# Patient Record
Sex: Female | Born: 1960 | Race: White | Hispanic: No | Marital: Married | Smoking: Never smoker
Health system: Southern US, Community
[De-identification: ages and names within clinical notes are randomized; demographics above are authoritative.]

## PROBLEM LIST (undated history)

## (undated) DIAGNOSIS — Z8659 Personal history of other mental and behavioral disorders: Secondary | ICD-10-CM

## (undated) DIAGNOSIS — R002 Palpitations: Secondary | ICD-10-CM

## (undated) DIAGNOSIS — F32A Depression, unspecified: Secondary | ICD-10-CM

## (undated) DIAGNOSIS — R7309 Other abnormal glucose: Secondary | ICD-10-CM

## (undated) DIAGNOSIS — F329 Major depressive disorder, single episode, unspecified: Secondary | ICD-10-CM

## (undated) HISTORY — DX: Major depressive disorder, single episode, unspecified: F32.9

## (undated) HISTORY — DX: Depression, unspecified: F32.A

## (undated) HISTORY — DX: Palpitations: R00.2

## (undated) HISTORY — PX: OTHER SURGICAL HISTORY: SHX169

## (undated) HISTORY — DX: Personal history of other mental and behavioral disorders: Z86.59

## (undated) HISTORY — DX: Other abnormal glucose: R73.09

## (undated) HISTORY — PX: HERNIA REPAIR: SHX51

---

## 1997-09-07 ENCOUNTER — Other Ambulatory Visit: Admission: RE | Admit: 1997-09-07 | Discharge: 1997-09-07 | Payer: Self-pay | Admitting: Obstetrics and Gynecology

## 1998-11-15 ENCOUNTER — Other Ambulatory Visit: Admission: RE | Admit: 1998-11-15 | Discharge: 1998-11-15 | Payer: Self-pay | Admitting: Obstetrics and Gynecology

## 2002-10-01 ENCOUNTER — Other Ambulatory Visit: Admission: RE | Admit: 2002-10-01 | Discharge: 2002-10-01 | Payer: Self-pay | Admitting: Obstetrics and Gynecology

## 2003-02-09 ENCOUNTER — Encounter: Admission: RE | Admit: 2003-02-09 | Discharge: 2003-02-09 | Payer: Self-pay | Admitting: Internal Medicine

## 2003-10-24 ENCOUNTER — Other Ambulatory Visit: Admission: RE | Admit: 2003-10-24 | Discharge: 2003-10-24 | Payer: Self-pay | Admitting: Obstetrics and Gynecology

## 2004-12-06 ENCOUNTER — Other Ambulatory Visit: Admission: RE | Admit: 2004-12-06 | Discharge: 2004-12-06 | Payer: Self-pay | Admitting: Obstetrics and Gynecology

## 2005-03-22 ENCOUNTER — Encounter: Admission: RE | Admit: 2005-03-22 | Discharge: 2005-03-22 | Payer: Self-pay | Admitting: Obstetrics and Gynecology

## 2006-07-21 ENCOUNTER — Ambulatory Visit: Payer: Self-pay | Admitting: Internal Medicine

## 2006-07-21 ENCOUNTER — Encounter: Payer: Self-pay | Admitting: Internal Medicine

## 2006-07-22 ENCOUNTER — Ambulatory Visit: Payer: Self-pay | Admitting: Cardiology

## 2006-09-22 ENCOUNTER — Ambulatory Visit: Payer: Self-pay | Admitting: Internal Medicine

## 2006-09-22 DIAGNOSIS — F3289 Other specified depressive episodes: Secondary | ICD-10-CM | POA: Insufficient documentation

## 2006-09-22 DIAGNOSIS — J309 Allergic rhinitis, unspecified: Secondary | ICD-10-CM | POA: Insufficient documentation

## 2006-09-22 DIAGNOSIS — Z8659 Personal history of other mental and behavioral disorders: Secondary | ICD-10-CM | POA: Insufficient documentation

## 2006-09-22 DIAGNOSIS — R002 Palpitations: Secondary | ICD-10-CM | POA: Insufficient documentation

## 2006-09-22 DIAGNOSIS — F329 Major depressive disorder, single episode, unspecified: Secondary | ICD-10-CM | POA: Insufficient documentation

## 2006-09-22 LAB — CONVERTED CEMR LAB
Bilirubin Urine: NEGATIVE
Glucose, Urine, Semiquant: NEGATIVE
Ketones, urine, test strip: NEGATIVE
Nitrite: NEGATIVE
Protein, U semiquant: NEGATIVE
Specific Gravity, Urine: 1.02
Urobilinogen, UA: 0.2
WBC Urine, dipstick: NEGATIVE
pH: 7

## 2006-09-30 LAB — CONVERTED CEMR LAB
ALT: 11 units/L (ref 0–35)
AST: 13 units/L (ref 0–37)
Albumin: 3.4 g/dL — ABNORMAL LOW (ref 3.5–5.2)
Alkaline Phosphatase: 35 units/L — ABNORMAL LOW (ref 39–117)
BUN: 9 mg/dL (ref 6–23)
Basophils Absolute: 0 10*3/uL (ref 0.0–0.1)
Basophils Relative: 0.6 % (ref 0.0–1.0)
Bilirubin, Direct: 0.2 mg/dL (ref 0.0–0.3)
CO2: 33 meq/L — ABNORMAL HIGH (ref 19–32)
Calcium: 9.1 mg/dL (ref 8.4–10.5)
Chloride: 108 meq/L (ref 96–112)
Cholesterol: 178 mg/dL (ref 0–200)
Creatinine, Ser: 0.9 mg/dL (ref 0.4–1.2)
Eosinophils Absolute: 0.1 10*3/uL (ref 0.0–0.6)
Eosinophils Relative: 2.4 % (ref 0.0–5.0)
Free T4: 0.7 ng/dL (ref 0.6–1.6)
GFR calc Af Amer: 87 mL/min
GFR calc non Af Amer: 72 mL/min
Glucose, Bld: 102 mg/dL — ABNORMAL HIGH (ref 70–99)
HCT: 37.4 % (ref 36.0–46.0)
HDL: 41.7 mg/dL (ref >39.0)
Hemoglobin: 13 g/dL (ref 12.0–15.0)
LDL Cholesterol: 128 mg/dL — ABNORMAL HIGH (ref 0–99)
Lymphocytes Relative: 32.3 % (ref 12.0–46.0)
MCHC: 34.8 g/dL (ref 30.0–36.0)
MCV: 86.4 fL (ref 78.0–100.0)
Monocytes Absolute: 0.3 10*3/uL (ref 0.2–0.7)
Monocytes Relative: 7.2 % (ref 3.0–11.0)
Neutro Abs: 2.6 10*3/uL (ref 1.4–7.7)
Neutrophils Relative %: 57.5 % (ref 43.0–77.0)
Platelets: 197 10*3/uL (ref 150–400)
Potassium: 4.7 meq/L (ref 3.5–5.1)
RBC: 4.33 M/uL (ref 3.87–5.11)
RDW: 12.3 % (ref 11.5–14.6)
Sodium: 144 meq/L (ref 135–145)
TSH: 1.23 microintl units/mL (ref 0.35–5.50)
Total Bilirubin: 1 mg/dL (ref 0.3–1.2)
Total CHOL/HDL Ratio: 4.3
Total Protein: 6.1 g/dL (ref 6.0–8.3)
Triglycerides: 43 mg/dL (ref 0–149)
VLDL: 9 mg/dL (ref 0–40)
WBC: 4.4 10*3/uL — ABNORMAL LOW (ref 4.5–10.5)

## 2006-10-15 ENCOUNTER — Encounter: Payer: Self-pay | Admitting: Internal Medicine

## 2006-10-15 ENCOUNTER — Encounter: Admission: RE | Admit: 2006-10-15 | Discharge: 2006-10-15 | Payer: Self-pay | Admitting: Obstetrics and Gynecology

## 2007-06-25 ENCOUNTER — Encounter: Payer: Self-pay | Admitting: Internal Medicine

## 2007-07-07 ENCOUNTER — Encounter: Payer: Self-pay | Admitting: Internal Medicine

## 2007-10-19 ENCOUNTER — Encounter: Admission: RE | Admit: 2007-10-19 | Discharge: 2007-10-19 | Payer: Self-pay | Admitting: Internal Medicine

## 2007-12-01 ENCOUNTER — Ambulatory Visit: Payer: Self-pay | Admitting: Internal Medicine

## 2007-12-01 DIAGNOSIS — R7309 Other abnormal glucose: Secondary | ICD-10-CM | POA: Insufficient documentation

## 2007-12-02 ENCOUNTER — Encounter: Payer: Self-pay | Admitting: Internal Medicine

## 2007-12-02 LAB — CONVERTED CEMR LAB
BUN: 13 mg/dL (ref 6–23)
CO2: 30 meq/L (ref 19–32)
Calcium: 9.2 mg/dL (ref 8.4–10.5)
Chloride: 102 meq/L (ref 96–112)
Creatinine, Ser: 0.9 mg/dL (ref 0.4–1.2)
GFR calc Af Amer: 87 mL/min
GFR calc non Af Amer: 72 mL/min
Glucose, Bld: 83 mg/dL (ref 70–99)
Hgb A1c MFr Bld: 5.1 % (ref 4.6–6.0)
Potassium: 4 meq/L (ref 3.5–5.1)
Sodium: 139 meq/L (ref 135–145)

## 2008-06-03 ENCOUNTER — Ambulatory Visit: Payer: Self-pay | Admitting: Internal Medicine

## 2008-06-03 DIAGNOSIS — N760 Acute vaginitis: Secondary | ICD-10-CM | POA: Insufficient documentation

## 2008-06-06 LAB — CONVERTED CEMR LAB
BUN: 16 mg/dL (ref 6–23)
CO2: 29 meq/L (ref 19–32)
Calcium: 8.7 mg/dL (ref 8.4–10.5)
Chloride: 106 meq/L (ref 96–112)
Creatinine, Ser: 0.9 mg/dL (ref 0.4–1.2)
Free T4: 0.8 ng/dL (ref 0.6–1.6)
GFR calc non Af Amer: 71.29 mL/min (ref >60)
Glucose, Bld: 86 mg/dL (ref 70–99)
Hgb A1c MFr Bld: 5.2 % (ref 4.6–6.5)
Potassium: 3.6 meq/L (ref 3.5–5.1)
Sodium: 140 meq/L (ref 135–145)
T3, Free: 2.4 pg/mL (ref 2.3–4.2)
TSH: 1.57 microintl units/mL (ref 0.35–5.50)

## 2008-07-05 ENCOUNTER — Ambulatory Visit: Payer: Self-pay | Admitting: Internal Medicine

## 2008-07-05 DIAGNOSIS — I4949 Other premature depolarization: Secondary | ICD-10-CM | POA: Insufficient documentation

## 2008-07-15 ENCOUNTER — Encounter: Payer: Self-pay | Admitting: Internal Medicine

## 2008-07-15 ENCOUNTER — Ambulatory Visit: Payer: Self-pay

## 2008-11-29 ENCOUNTER — Encounter: Admission: RE | Admit: 2008-11-29 | Discharge: 2008-11-29 | Payer: Self-pay | Admitting: Obstetrics and Gynecology

## 2009-05-25 ENCOUNTER — Ambulatory Visit: Payer: Self-pay | Admitting: Sports Medicine

## 2009-05-25 DIAGNOSIS — M79609 Pain in unspecified limb: Secondary | ICD-10-CM | POA: Insufficient documentation

## 2009-05-25 DIAGNOSIS — M25519 Pain in unspecified shoulder: Secondary | ICD-10-CM | POA: Insufficient documentation

## 2009-05-25 DIAGNOSIS — M2170 Unequal limb length (acquired), unspecified site: Secondary | ICD-10-CM | POA: Insufficient documentation

## 2009-06-28 ENCOUNTER — Ambulatory Visit: Payer: Self-pay | Admitting: Sports Medicine

## 2009-06-28 DIAGNOSIS — R269 Unspecified abnormalities of gait and mobility: Secondary | ICD-10-CM | POA: Insufficient documentation

## 2009-11-30 ENCOUNTER — Encounter: Admission: RE | Admit: 2009-11-30 | Discharge: 2009-11-30 | Payer: Self-pay | Admitting: Obstetrics and Gynecology

## 2009-12-04 ENCOUNTER — Ambulatory Visit: Payer: Self-pay | Admitting: Internal Medicine

## 2009-12-04 DIAGNOSIS — M25549 Pain in joints of unspecified hand: Secondary | ICD-10-CM | POA: Insufficient documentation

## 2009-12-04 LAB — CONVERTED CEMR LAB: Glucose, Bld: 154 mg/dL

## 2010-03-01 NOTE — Progress Notes (Signed)
Summary: [facility]  [facility]   Imported By: Sherian Rein 07/08/2009 10:12:04  _____________________________________________________________________  External Attachment:    Type:   Image     Comment:   External Document

## 2010-03-01 NOTE — Assessment & Plan Note (Signed)
Summary: follow up appt/ssc   Vital Signs:  Patient profile:   50 year old female Menstrual status:  regular LMP:     11/20/2009 Weight:      173 pounds Pulse rate:   72 / minute BP sitting:   100 / 60  (right arm) Cuff size:   regular  Vitals Entered By: Romualdo Bolk, CMA (AAMA) (December 04, 2009 1:21 PM) CC: follow-up visit on prozac. Pt wants to discuss sore joints in hands.  LMP (date): 11/20/2009 Menarche (age onset years): 16   Menses interval (days): 28 Menstrual flow (days): 4-5 Enter LMP: 11/20/2009   History of Present Illness: Joyce Jefferson comes in today  for follow up of meds . in regard to her PROZAC  shetakes  it about 5 days per  week   ( forgets some)  but usually compliance .  When weans off   gets irritable and anxious.   still has teen ager at home .  Summer  had  Gyne check in   Dr Marcelle Overlie.  Mammo: utd. ocass vaginal  odor.   ?.   no discharge     Exercises  and training for 1/2 marathon and does yoga.   has leg length discrep   has insert .  Gets  soreness in some finger  but no stiffness every am and no swelling seem area infovlve is pip area.  No injury   Preventive Screening-Counseling & Management  Alcohol-Tobacco     Alcohol drinks/day: 2     Alcohol type: wine     Smoking Status: never  Caffeine-Diet-Exercise     Caffeine use/day: 4     Does Patient Exercise: yes     Type of exercise: Eplicitcal, walk and wts     Times/week: 5  Current Medications (verified): 1)  Fluoxetine Hcl 20 Mg Caps (Fluoxetine Hcl) .Marland Kitchen.. 1 By Mouth Once Daily  Allergies (verified): 1)  ! Penicillin 2)  ! Sulfa  Past History:  Past medical, surgical, family and social histories (including risk factors) reviewed, and no changes noted (except as noted below).  Past Medical History: VAGINITIS (ICD-616.10) PALPITATIONS, RECURRENT (ICD-785.1)   echo 2010 HYPERGLYCEMIA, MILD (ICD-790.29) Hx of BULIMIA, HX OF (ICD-V11.8) DEPRESSION  (ICD-311) ALLERGIC RHINITIS (ICD-477.9)  Past Surgical History: Reviewed history from 07/05/2008 and no changes required. Repair of Prolapsed Rectum  Hernia repair  Past History:  Care Management: None Current Gynecology: [place]  Family History: Reviewed history from 07/05/2008 and no changes required. Family History of Alcoholism/Addiction Family History Hypertension Family History of Colon CA 1st degree relative <60 mom  + diabetes son had vsd that closed?  Neg thyroid  sig arrythmias.     Social History: Reviewed history from 07/05/2008 and no changes required. Lives in [place]. Married Never Smoked Alcohol use-yes  (1-2 glasses of wine/day) Regular exercise-yes Owns a wine import company  Review of Systems  The patient denies anorexia, fever, weight loss, weight gain, vision loss, decreased hearing, abdominal pain, melena, hematochezia, severe indigestion/heartburn, muscle weakness, enlarged lymph nodes, and angioedema.         only ocass palpitations  Physical Exam  General:  Well-developed,well-nourished,in no acute distress; alert,appropriate and cooperative throughout examination Head:  normocephalic and atraumatic.   Eyes:  vision grossly intact.   Neck:  No deformities, masses, or tenderness noted. Lungs:  Normal respiratory effort, chest expands symmetrically. Lungs are clear to auscultation, no crackles or wheezes. Heart:  Normal rate and regular rhythm. S1 and S2  normal without gallop, murmur, click, rub or other extra sounds.no lifts.   Abdomen:  Bowel sounds positive,abdomen soft and non-tender without masses, organomegaly or   noted. Msk:   no swelling of joints in hands and no nodes  good ROM  Pulses:  pulses intact without delay   Extremities:  no clubbing cyanosis or edema  Neurologic:  non focal  Skin:  turgor normal, color normal, no ecchymoses, and no petechiae.   Cervical Nodes:  No lymphadenopathy noted Psych:  Oriented X3, normally  interactive, good eye contact, not anxious appearing, and not depressed appearing.     Impression & Recommendations:  Problem # 1:  DEPRESSION (ICD-311) stable    disc continuation of med at this time Her updated medication list for this problem includes:    Fluoxetine Hcl 20 Mg Caps (Fluoxetine hcl) .Marland Kitchen... 1 by mouth once daily  Problem # 2:  HYPERGLYCEMIA, MILD (ICD-790.29) is pp today   slightly iup but  ok      lifestyle intervention and follow  hg a1c nl in past  Problem # 3:  PALPITATIONS, RECURRENT (ICD-785.1) Assessment: Improved less  cards consult in the past nl echo   Problem # 4:  ALLERGIC RHINITIS (ICD-477.9) Assessment: Comment Only  Problem # 5:  JOINT PAIN, HAND (ICD-719.44) seems like minor arthritis    call with alarm features etc.   Complete Medication List: 1)  Fluoxetine Hcl 20 Mg Caps (Fluoxetine hcl) .Marland Kitchen.. 1 by mouth once daily  Other Orders: Admin 1st Vaccine (16109) Flu Vaccine 54yrs + (60454)  Patient Instructions: 1)  continue   medication. and healthy lifestyle  2)  next year  do   full cpx with  labs.  Prescriptions: FLUOXETINE HCL 20 MG CAPS (FLUOXETINE HCL) 1 by mouth once daily  #30 x 11   Entered and Authorized by:   Madelin Headings MD   Signed by:   Madelin Headings MD on 12/04/2009   Method used:   Electronically to        [facility]       [address]       [place], [place]  [address]       Ph: [phone]       Fax: [phone]   RxID:   [id number]    Orders Added: 1)  Admin 1st Vaccine [90471] 2)  Flu Vaccine 52yrs + [27253] 3)  Est. Patient Level IV [66440]         Flu Vaccine Consent Questions     Do you have a history of severe allergic reactions to this vaccine? no    Any prior history of allergic reactions to egg and/or gelatin? no    Do you have a sensitivity to the preservative Thimersol? no    Do you have a past history of Guillan-Barre Syndrome? no    Do you  currently have an acute febrile illness? no    Have you ever had a severe reaction to latex? no    Vaccine information given and explained to patient? yes    Are you currently pregnant? no    Lot Number:[id number]   Exp Date:07/28/2010   Site Given  Left Deltoid IM .lbflu Romualdo Bolk, CMA (AAMA)  December 04, 2009 1:24 PM  Laboratory Results   Blood Tests     Glucose (random): 154 mg/dL   (Normal Range: 34-742)  Comments: Romualdo Bolk, CMA (AAMA)  December 04, 2009 1:48 PM

## 2010-03-01 NOTE — Assessment & Plan Note (Signed)
Summary: ORTHOTICS,MC   Vital Signs:  Patient profile:   50 year old female Menstrual status:  regular BP sitting:   91 / 59  Vitals Entered By: Lillia Pauls CMA (June 28, 2009 9:41 AM)  Referring Provider:  Berniece Andreas, MD Primary Provider:  Madelin Headings MD   History of Present Illness: Hx of bilat foot pain this recurs w running had PF at one time improved in sport insole w lift gait felt better and able to run  comes for custom orthotic  note has not done shoulder exercise regimen very much  not much change in those sxsi  Allergies: 1)  ! Penicillin 2)  ! Sulfa  Physical Exam  General:  Well-developed,well-nourished,in no acute distress; alert,appropriate and cooperative throughout examination Msk:  Msk:  left rear foot valgus pronation of RT w loss of long arch lat 5th hammer  RT midfoot shift pronation 5th hammer toe  lat foot bilat curing inward  norm post tib fxn Extremities:  gait coorrects in sports inserts w lift otherwise leans to shorter side   Impression & Recommendations:  Problem # 1:  ABNORMALITY OF GAIT (ICD-781.2)  Patient was fitted for a standard, cushioned, semi-rigid orthotic.  The orthotic was heated and the patient stood on the orthotic blank positioned on the orthotic stand. The patient was positioned in subtalar neutral position and 10 degrees of ankle dorsiflexion in a weight bearing stance. After completion of molding a stable based was applied to the orthotic blank.   The blank was ground to a stable position for weight bearing. size 10 red cambray base  blue EVA mid dens posting  lift on RT with taper - burgundy EVA additional orthotic padding  none  40 mins  Orders: Games developer, each unit (L3002)  Problem # 2:  UNEQUAL LEG LENGTH (ICD-736.81)  corrected w orthotic  this seems to trigger the abn gait  Orders: Orthotic Materials, each unit (Z6109)  Problem # 3:  FOOT PAIN, BILATERAL  (ICD-729.5)  better in sport insoles  will try to use custom oththoics for running as this cuts her pain  Orders: Orthotic Materials, each unit (L3002)  Complete Medication List: 1)  Fluoxetine Hcl 20 Mg Tabs (Fluoxetine hcl) .... 1/2 by mouth two times a day or as directed

## 2010-03-01 NOTE — Assessment & Plan Note (Signed)
Summary: NP,HEEL/SHOULDER PAIN,MC   Vital Signs:  Patient profile:   50 year old female Menstrual status:  regular Height:      69 inches Weight:      165 pounds BMI:     24.45 BP sitting:   111 / 73  Vitals Entered By: Joyce Jefferson CMA (May 25, 2009 11:33 AM)  Referring Provider:  Berniece Andreas, MD Primary Provider:  Madelin Headings MD   History of Present Illness: RT shoulder pain for almost a year does Yoga =- gentle yoga at sport time Friend of Joyce Jefferson who sent her here  down dog painful abduction painful no specific injury not wakening at nite  runs 3 to 4 x per wk sharp pain in bottom of heel on left 1 mo ago feeling better now  RT PF 3 years ago  was tried in rigid half lenght orthotic and this was painful  Allergies: 1)  ! Penicillin 2)  ! Sulfa  Physical Exam  General:  Well-developed,well-nourished,in no acute distress; alert,appropriate and cooperative throughout examination Msk:  left rear foot valgus pronation of RT w loss of long arch lat 5th hammer  RT midfoot shift pronation 5th hammer toe  lat foot bilat curing inward  norm post tib fxn  left leg is 1.5 cm long Extremities:  Inspection reveals no abnormalities or assymetry; no atrophy noted; palpation is unremarkable;  ROM is full in all planes. specific strength testing of Rotator cuff mm reveals good strength throughout;  mild sings of impingement;  speeds and yergason's tests normal;  no labral pathology noted; norm scapular function observed.  negative painful arc and no drop arm sign.  Neurologic:  Gait is very good However without leg length correction she leans to RT side   Impression & Recommendations:  Problem # 1:  SHOULDER PAIN, RIGHT (ICD-719.41) This seems like a minor RC inujuty with suprspinatus tendon irritation  will give a std rehabilitaiton program using therabeand that she can do at [facility]  if persistent will do more eval  Problem # 2:  FOOT PAIN,  BILATERAL (ICD-729.5)  This is associated with a lot of foot breakdown  I think she is an excellent candidate for custom orhotics  will RTC for these  trial on temporary sports insole  Orders: Sports Insoles (L3510)  Problem # 3:  UNEQUAL LEG LENGTH (ICD-736.81)  tapered wedge added to RT orhtoitc to help correct lenght  this feels very comfortable and improves her running gait  Orders: Sports Insoles (L3510)  Complete Medication List: 1)  Fluoxetine Hcl 20 Mg Tabs (Fluoxetine hcl) .... 1/2 by mouth two times a day or as directed

## 2010-08-09 IMAGING — MG MM SCREEN MAMMOGRAM BILATERAL
4 series · 4 of 4 positions shown · non-contrast
Comparison: none

DG SCREEN MAMMOGRAM BILATERAL
Bilateral CC and MLO view(s) were taken.

DIGITAL SCREENING MAMMOGRAM WITH CAD:
The breast tissue is heterogeneously dense.  No masses or malignant type calcifications are 
identified.  Compared with prior studies.

[R CC]
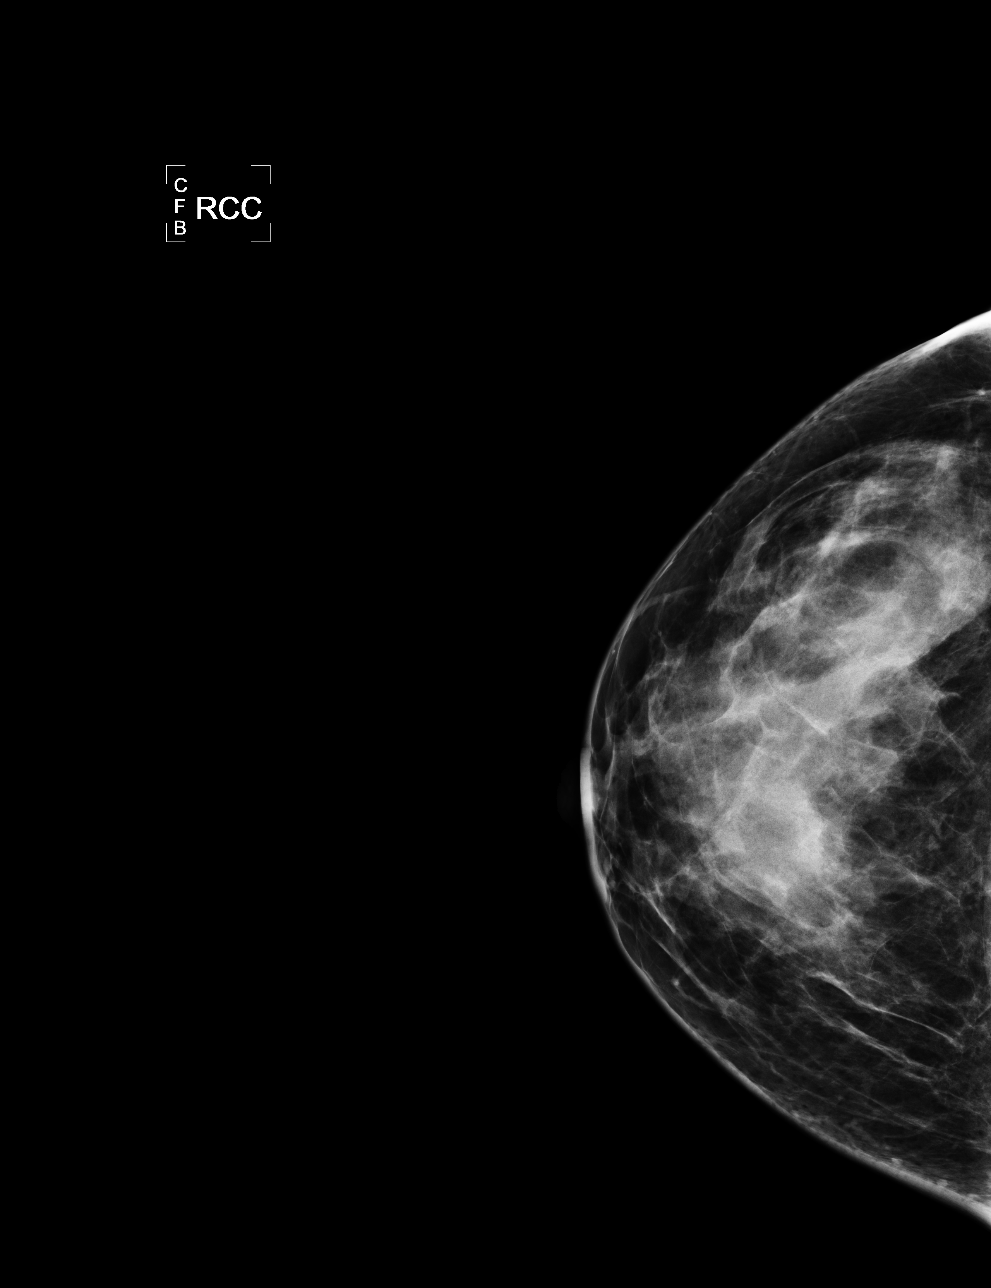

[L CC]
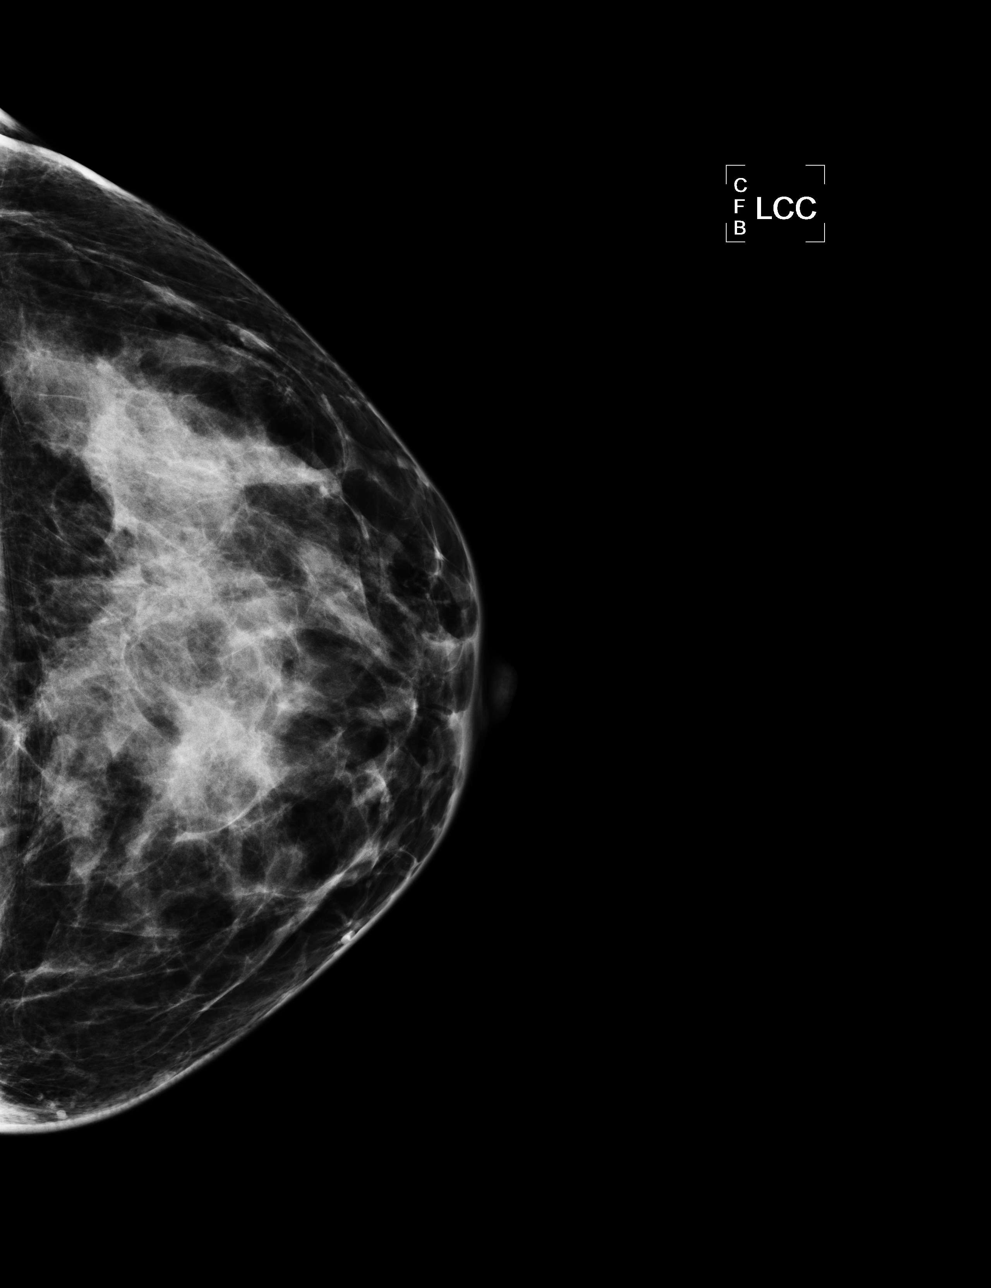

[L MLO]
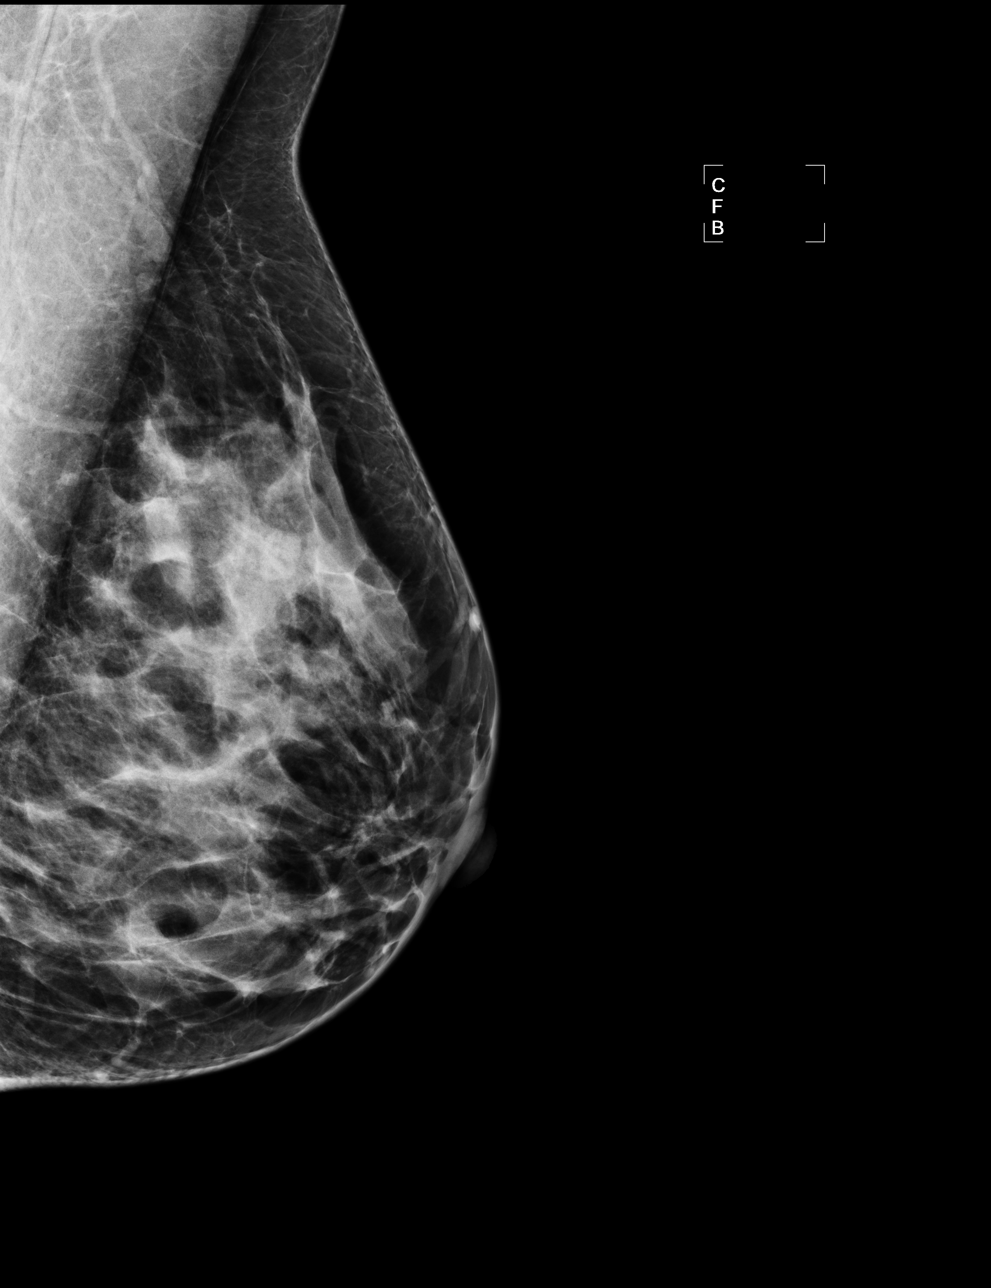

[R MLO]
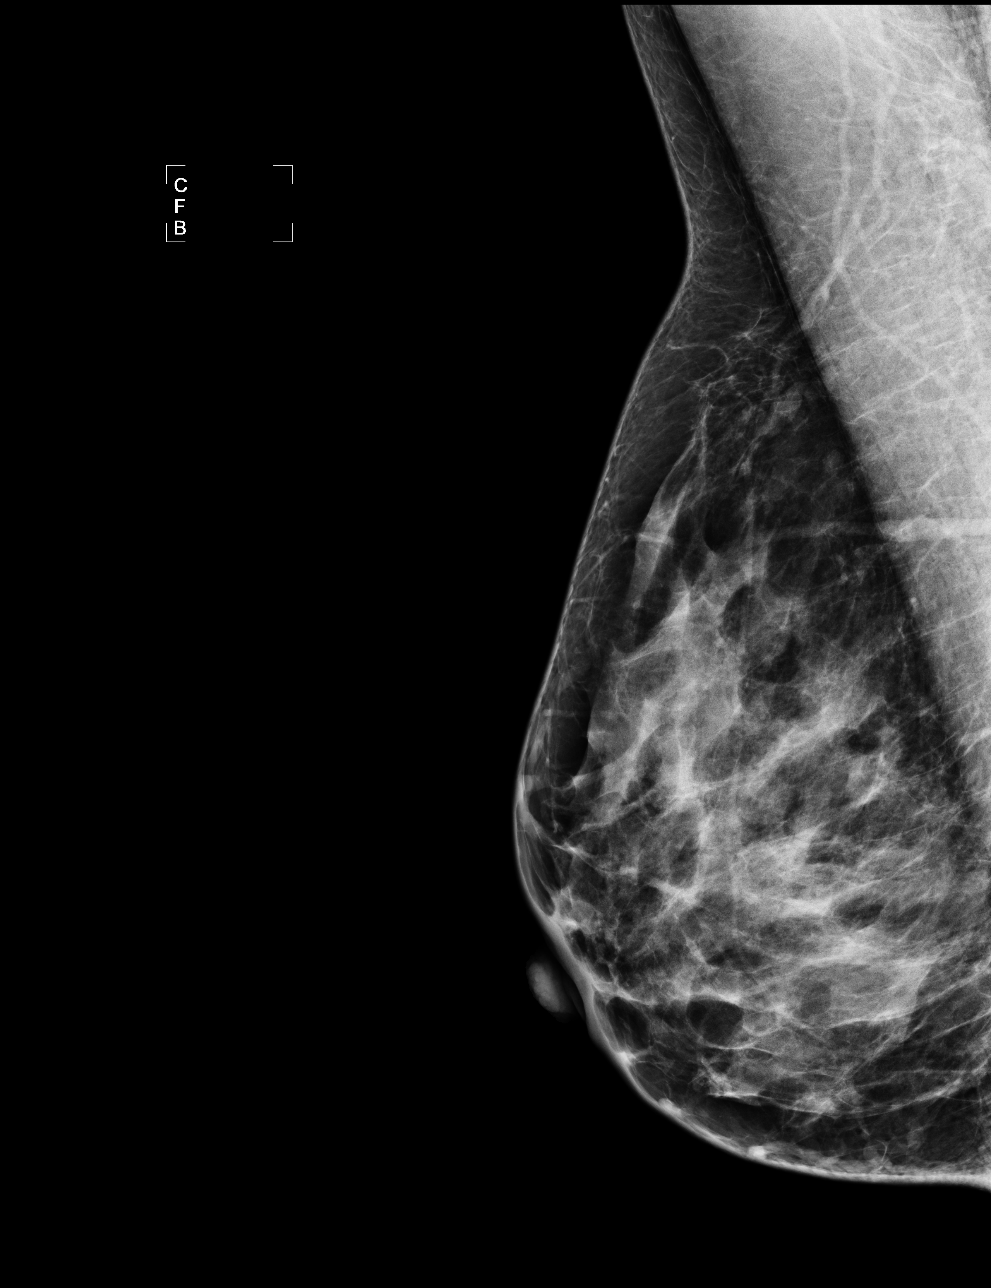

[4 of 4 positions shown; findings below may reference images not displayed]

IMPRESSION: No specific mammographic evidence of malignancy.  Next screening mammogram is recommended in one 
year.

ASSESSMENT: Negative - BI-RADS 1

Screening mammogram in 1 year.
ANALYZED BY COMPUTER AIDED DETECTION. , THIS PROCEDURE WAS A DIGITAL MAMMOGRAM.

## 2010-08-15 ENCOUNTER — Encounter: Payer: Self-pay | Admitting: Internal Medicine

## 2010-08-15 ENCOUNTER — Ambulatory Visit (INDEPENDENT_AMBULATORY_CARE_PROVIDER_SITE_OTHER): Payer: Self-pay | Admitting: Internal Medicine

## 2010-08-15 VITALS — BP 100/70 | HR 60 | Temp 98.4°F

## 2010-08-15 DIAGNOSIS — L988 Other specified disorders of the skin and subcutaneous tissue: Secondary | ICD-10-CM | POA: Insufficient documentation

## 2010-08-15 DIAGNOSIS — L989 Disorder of the skin and subcutaneous tissue, unspecified: Secondary | ICD-10-CM

## 2010-08-15 NOTE — Progress Notes (Signed)
  Subjective:    Patient ID: Joyce Jefferson, female    DOB: [birth year], 50 y.o.   MRN: [id number]  HPI Comesin today for new problem NOted red bump left breast that lasted 2-3 weeks and then resolved. Then has had a few weeks of red bump on right lateral breast area without  Pain itching or othre.walks the dog and gets hot and sweaty and may be from this but just wants to get checked to be safe.   Review of Systems No fever feels well.    Objective:   Physical Exam WDWN in nad  Breasts/skin    Right  With 1 mm rd area with ? Central pore and no tenderness but some seed like area beneath this. No specific breast lump    No discharge  Axilla is clear      Assessment & Plan:  Skin bump  On breast   prob not breast related  Seems skin related . Fu if  persistent or progressive

## 2010-08-15 NOTE — Patient Instructions (Signed)
This seems like a skin cyst .     But recheck with [place]   persistent or progressive . Can use warm compress .

## 2010-09-17 ENCOUNTER — Other Ambulatory Visit (INDEPENDENT_AMBULATORY_CARE_PROVIDER_SITE_OTHER): Payer: BC Managed Care – PPO

## 2010-09-17 DIAGNOSIS — Z0000 Encounter for general adult medical examination without abnormal findings: Secondary | ICD-10-CM

## 2010-09-17 LAB — BASIC METABOLIC PANEL
BUN: 9 mg/dL (ref 6–23)
CO2: 31 mEq/L (ref 19–32)
Calcium: 9 mg/dL (ref 8.4–10.5)
Chloride: 104 mEq/L (ref 96–112)
Creatinine, Ser: 0.9 mg/dL (ref 0.4–1.2)
GFR: 74.41 mL/min (ref >60.00)
Glucose, Bld: 98 mg/dL (ref 70–99)
Potassium: 4.8 mEq/L (ref 3.5–5.1)
Sodium: 141 mEq/L (ref 135–145)

## 2010-09-17 LAB — POCT URINALYSIS DIPSTICK
Bilirubin, UA: NEGATIVE
Glucose, UA: NEGATIVE
Nitrite, UA: NEGATIVE
Protein, UA: NEGATIVE
Spec Grav, UA: 1.015
Urobilinogen, UA: 0.2
pH, UA: NEGATIVE

## 2010-09-17 LAB — CBC WITH DIFFERENTIAL/PLATELET
Basophils Absolute: 0 10*3/uL (ref 0.0–0.1)
Basophils Relative: 0.5 % (ref 0.0–3.0)
Eosinophils Absolute: 0.1 10*3/uL (ref 0.0–0.7)
Eosinophils Relative: 2.4 % (ref 0.0–5.0)
HCT: 40.1 % (ref 36.0–46.0)
Lymphs Abs: 1.5 10*3/uL (ref 0.7–4.0)
MCHC: 33.7 g/dL (ref 30.0–36.0)
MCV: 90.1 fl (ref 78.0–100.0)
Monocytes Absolute: 0.4 10*3/uL (ref 0.1–1.0)
Monocytes Relative: 7.9 % (ref 3.0–12.0)
Neutro Abs: 2.7 10*3/uL (ref 1.4–7.7)
Neutrophils Relative %: 58.2 % (ref 43.0–77.0)
Platelets: 176 10*3/uL (ref 150.0–400.0)
RBC: 4.46 Mil/uL (ref 3.87–5.11)
RDW: 13.7 % (ref 11.5–14.6)
WBC: 4.7 10*3/uL (ref 4.5–10.5)

## 2010-09-17 LAB — HEPATIC FUNCTION PANEL
ALT: 12 U/L (ref 0–35)
AST: 17 U/L (ref 0–37)
Alkaline Phosphatase: 33 U/L — ABNORMAL LOW (ref 39–117)
Bilirubin, Direct: 0.1 mg/dL (ref 0.0–0.3)
Total Bilirubin: 0.9 mg/dL (ref 0.3–1.2)

## 2010-09-17 LAB — LIPID PANEL
Cholesterol: 184 mg/dL (ref 0–200)
HDL: 56.4 mg/dL (ref >39.00)
LDL Cholesterol: 117 mg/dL — ABNORMAL HIGH (ref 0–99)
Total CHOL/HDL Ratio: 3
Triglycerides: 52 mg/dL (ref 0.0–149.0)
VLDL: 10.4 mg/dL (ref 0.0–40.0)

## 2010-09-18 LAB — VITAMIN D 25 HYDROXY (VIT D DEFICIENCY, FRACTURES): Vit D, 25-Hydroxy: 39 ng/mL (ref 30–89)

## 2010-09-24 ENCOUNTER — Encounter: Payer: Self-pay | Admitting: Internal Medicine

## 2010-09-24 ENCOUNTER — Ambulatory Visit (INDEPENDENT_AMBULATORY_CARE_PROVIDER_SITE_OTHER): Payer: BC Managed Care – PPO | Admitting: Internal Medicine

## 2010-09-24 VITALS — BP 120/80 | HR 60 | Ht 69.0 in | Wt 170.0 lb

## 2010-09-24 DIAGNOSIS — F329 Major depressive disorder, single episode, unspecified: Secondary | ICD-10-CM

## 2010-09-24 DIAGNOSIS — Z0000 Encounter for general adult medical examination without abnormal findings: Secondary | ICD-10-CM

## 2010-09-24 DIAGNOSIS — I4949 Other premature depolarization: Secondary | ICD-10-CM

## 2010-09-24 DIAGNOSIS — R82998 Other abnormal findings in urine: Secondary | ICD-10-CM

## 2010-09-24 DIAGNOSIS — R002 Palpitations: Secondary | ICD-10-CM

## 2010-09-24 DIAGNOSIS — R8290 Unspecified abnormal findings in urine: Secondary | ICD-10-CM

## 2010-09-24 DIAGNOSIS — F3289 Other specified depressive episodes: Secondary | ICD-10-CM

## 2010-09-24 DIAGNOSIS — Z8659 Personal history of other mental and behavioral disorders: Secondary | ICD-10-CM

## 2010-09-24 LAB — POCT URINALYSIS DIPSTICK
Bilirubin, UA: NEGATIVE
Glucose, UA: NEGATIVE
Ketones, UA: NEGATIVE
Leukocytes, UA: NEGATIVE
Nitrite, UA: NEGATIVE
Protein, UA: NEGATIVE
Spec Grav, UA: 1.01
Urobilinogen, UA: 0.2
pH, UA: 7.5

## 2010-09-24 NOTE — Patient Instructions (Signed)
Continue lifestyle intervention healthy eating and exercise . I think you are healthy  PAP in 1-2 years.

## 2010-09-24 NOTE — Progress Notes (Signed)
  Subjective:    Patient ID: Joyce Jefferson, female    DOB: August 21, 1960, 50 y.o.   MRN: [id number]  HPI Patient comes in today for Preventive Health Care visit  No major change in health status since last visit . Exercises still walking dog and hiking . Last pap dr Marcelle Overlie last year with monitor abnormality now better . No hx of serious abnormal pap. O fluoxetine doing well.     Review of Systems ROS:  GEN/ HEENTNo fever, significant weight changes sweats headaches vision problems hearing changes, CV/ PULM; No chest pain shortness of breath cough, syncope,edema  change in exercise tolerance. Hx of palpitations and evaluation in the past with Premature beats.  No increase in  Any sx or exercise intolerance . Exercises regularly GI /GU: No adominal pain, vomiting, change in bowel habits. No blood in the stool. No significant GU symptoms. SKIN/HEME: ,no acute skin rashes suspicious lesions or bleeding. No lymphadenopathy, nodules, masses.  NEURO/ PSYCH:  No neurologic signs such as weakness numbness No depression anxiety. IMM/ Allergy: No unusual infections.  Allergy .   REST of 12 system review negative  Past history family history social history reviewed in the electronic medical record.     Objective:   Physical Exam Physical Exam: Vital signs reviewed WUJ:WJXB is a well-developed well-nourished alert cooperative  white female who appears her stated age in no acute distress.  HEENT: normocephalic  atraumatic , Eyes: PERRL EOM's full, conjunctiva clear, Nares: paten,t no deformity discharge or tenderness., Ears: no deformity EAC's clear TMs with normal landmarks. Mouth: clear OP, no lesions, edema.  Moist mucous membranes. Dentition in adequate repair. NECK: supple without masses, thyromegaly or bruits. CHEST/PULM:  Clear to auscultation and percussion breath sounds equal no wheeze , rales or rhonchi. No chest wall deformities or tenderness. CV: PMI is nondisplaced, S1 S2 no  gallops, murmurs, rubs. Peripheral pulses are full without delay.No JVD .   Intermittent premature beat  Breast: normal by inspection . No dimpling, discharge, masses, tenderness or discharge . LN: no cervical axillary inguinal adenopathy ABDOMEN: Bowel sounds normal nontender  No guard or rebound, no hepato splenomegal no CVA tenderness.  No hernia. Extremtities:  No clubbing cyanosis or edema, no acute joint swelling or redness no focal atrophy NEURO:  Oriented x3, cranial nerves 3-12 appear to be intact, no obvious focal weakness,gait within normal limits no abnormal reflexes or asymmetrical SKIN: No acute rashes normal turgor, color, no bruising or petechiae. PSYCH: Oriented, good eye contact, no obvious depression anxiety, cognition and judgment appear normal. Labs reviewed with patient. UA    2+ blood Low volume    ? If period related     Assessment & Plan:  Preventive Health Care Counseled regarding healthy nutrition, exercise, sleep, injury prevention, calcium vit d and healthy weight . Up to date  on healthcare parameters per hx  Abn ua screen in a female Repeat ua today   And if abn to get m icro  Pap every 2-3 years  Disc menopause and perimenopausal sx   And Hrt and asa indications   Mood: Stable on fluoxetine continue Palpitations in past noted to be premature beats and no structural heart disease.

## 2010-09-27 ENCOUNTER — Other Ambulatory Visit (INDEPENDENT_AMBULATORY_CARE_PROVIDER_SITE_OTHER): Payer: BC Managed Care – PPO

## 2010-09-27 DIAGNOSIS — R82998 Other abnormal findings in urine: Secondary | ICD-10-CM

## 2010-09-27 DIAGNOSIS — R8290 Unspecified abnormal findings in urine: Secondary | ICD-10-CM

## 2010-09-27 LAB — URINALYSIS, ROUTINE W REFLEX MICROSCOPIC
Bilirubin Urine: NEGATIVE
Leukocytes, UA: NEGATIVE
Specific Gravity, Urine: 1.01 (ref 1.000–1.030)
Total Protein, Urine: NEGATIVE
Urine Glucose: NEGATIVE
Urobilinogen, UA: 0.2 (ref 0.0–1.0)
pH: 6 (ref 5.0–8.0)

## 2010-09-28 NOTE — Progress Notes (Signed)
Left message on machine about results. 

## 2010-09-29 DIAGNOSIS — Z0000 Encounter for general adult medical examination without abnormal findings: Secondary | ICD-10-CM | POA: Insufficient documentation

## 2010-09-29 NOTE — Assessment & Plan Note (Signed)
No change or progression

## 2010-10-04 ENCOUNTER — Encounter: Payer: Self-pay | Admitting: Sports Medicine

## 2010-10-04 ENCOUNTER — Ambulatory Visit (INDEPENDENT_AMBULATORY_CARE_PROVIDER_SITE_OTHER): Payer: BC Managed Care – PPO | Admitting: Sports Medicine

## 2010-10-04 VITALS — BP 100/68 | HR 71 | Ht 69.0 in | Wt 168.0 lb

## 2010-10-04 DIAGNOSIS — M25569 Pain in unspecified knee: Secondary | ICD-10-CM

## 2010-10-04 DIAGNOSIS — M222X9 Patellofemoral disorders, unspecified knee: Secondary | ICD-10-CM | POA: Insufficient documentation

## 2010-10-04 NOTE — Progress Notes (Signed)
  Subjective:    Patient ID: Joyce Jefferson, female    DOB: November 21, 1960, 50 y.o.   MRN: [id number]  HPI  Pt presents with complaint of left knee pain  Left knee - noticed pain a few weeks ago when she [place] doing hiking, dance class, and side-to-side movement - denies swelling, bruising, trauma to the knee at this time - sharp ain, comes and goes, behind knee cap, no radiation - ice and ibuprofen make it better - notes some grinding and popping - worse with activity as described above and kneeling on left knee, although OK with walking, eliptical and yoga - hx of left knee injury with swelling when she was 50 yrs old, but resolved fully with PT  Pain off/on throughout the years, esp after long runs Right knee unaffected  Training for a half marathon in December, wants to make sure she is not making knee worse  Review of Systems  Denies fever, chills. Denies nausea, vomiting. Denies cough, shortness of breath. Denies chest pain, palpitations.      Objective:   Physical Exam  Constitutional: NAD, sitting on exam table Skin: no bruising, rash, warmth Neuro: no light touch sensation deficits, patellar reflexes 2+ bilaterally  Left Knee: Normal to inspection with no erythema or effusion or obvious bony abnormalities. Palpation normal with no warmth or joint line tenderness or patellar tenderness or condyle tenderness. Supra-lateral patellar grind at 45 deg flexion. ROM normal in flexion and extension and lower leg rotation. Ligaments with solid consistent endpoints including ACL, PCL, LCL, MCL. Negative Mcmurray's and provocative meniscal tests. Positive painful patellar compression. Patellar and quadriceps tendons unremarkable. Hamstring and quadriceps strength is normal, with slight bilateral VMO atrophy  Hip abduction strong 5/5 bilaterally.      Assessment & Plan:

## 2010-10-15 ENCOUNTER — Ambulatory Visit: Payer: BC Managed Care – PPO | Admitting: Sports Medicine

## 2010-11-07 ENCOUNTER — Telehealth: Payer: Self-pay | Admitting: Internal Medicine

## 2010-11-07 NOTE — Telephone Encounter (Signed)
Per Dr. Fabian Sharp- next step would be to go to the urologist. Molli Knock to refer if pt would like.

## 2010-11-07 NOTE — Telephone Encounter (Signed)
Pt is concerned that her labs results from her cpx came back with blood in her urine. Pt was talk there was nothing to worry about but she feel that maybe some additional tests should be done. Please contact pt

## 2010-11-08 NOTE — Telephone Encounter (Signed)
Pt aware and order sent to [facility]. 

## 2010-12-07 ENCOUNTER — Other Ambulatory Visit: Payer: Self-pay | Admitting: Internal Medicine

## 2010-12-17 ENCOUNTER — Other Ambulatory Visit: Payer: Self-pay | Admitting: Obstetrics and Gynecology

## 2010-12-17 DIAGNOSIS — Z1231 Encounter for screening mammogram for malignant neoplasm of breast: Secondary | ICD-10-CM

## 2011-01-18 ENCOUNTER — Ambulatory Visit
Admission: RE | Admit: 2011-01-18 | Discharge: 2011-01-18 | Disposition: A | Discharge status: Home / Self Care | Payer: BC Managed Care – PPO | Source: Ambulatory Visit | Attending: Obstetrics and Gynecology | Admitting: Obstetrics and Gynecology

## 2011-01-18 DIAGNOSIS — Z1231 Encounter for screening mammogram for malignant neoplasm of breast: Secondary | ICD-10-CM

## 2011-07-03 ENCOUNTER — Encounter: Payer: Self-pay | Admitting: Family Medicine

## 2011-07-03 ENCOUNTER — Ambulatory Visit (INDEPENDENT_AMBULATORY_CARE_PROVIDER_SITE_OTHER): Payer: BC Managed Care – PPO | Admitting: Family Medicine

## 2011-07-03 VITALS — BP 101/44 | HR 35 | Temp 98.0°F | Ht 69.0 in | Wt 165.0 lb

## 2011-07-03 DIAGNOSIS — M25512 Pain in left shoulder: Secondary | ICD-10-CM

## 2011-07-03 DIAGNOSIS — M25519 Pain in unspecified shoulder: Secondary | ICD-10-CM

## 2011-07-03 NOTE — Patient Instructions (Signed)
You have rotator cuff syndrome (impingement, tendinopathy, subacromial bursitis) - all these sub-conditions are treated similarly. Try to avoid painful activities (overhead activities, lifting with extended arm) as much as possible. Ibuprofen 600mg  three times a day with food x 7 days then as needed. Subacromial injection may be beneficial to help with pain and to decrease inflammation if this becomes constant. Start home exercise program as shown with theraband - 3 sets of 10 each exercise once a day next 6 weeks. Consider formal physical therapy, injection, nitro patches if not improving as expected. Follow up with me in 5-6 weeks or as needed.

## 2011-07-04 ENCOUNTER — Encounter: Payer: Self-pay | Admitting: Family Medicine

## 2011-07-04 DIAGNOSIS — M25512 Pain in left shoulder: Secondary | ICD-10-CM | POA: Insufficient documentation

## 2011-07-04 NOTE — Assessment & Plan Note (Signed)
2/2 rotator cuff syndrome.  Excellent strength - doubt rotator cuff tear.  Start ibuprofen regularly x 7 days then as needed.  Shown home exercise program with theraband and scapular stabilization exercises.  Offered injection (stated at this point with pain improved I don't think it's absolutely necessary) - patient declined.  Icing, tylenol as needed.  Consider formal PT, injection, nitro patches if not improving as expected over next 4-6 weeks.

## 2011-07-04 NOTE — Progress Notes (Signed)
Subjective:    Patient ID: Joyce Jefferson, female    DOB: March 31, 1960, 51 y.o.   MRN: [id number]  PCP: Dr. Fabian Sharp  HPI 51 yo F here for left shoulder pain.  Patient denies known injury. States for past several months has had intermittent left shoulder pain. Worse when she does a lot of overhead weightlifting (most recently seemed to flare up with kettlebell program). Was really throbbing yesterday laterally - took ibuprofen 600mg  and feels 90% improved today. Was given a home exercise program with theraband in the fall but she did not do this. + night pain. No numbness or tingling. No neck pain. Pain worse with lifting ahead, overhead, and behind back. No right shoulder issues. Is right handed.  Past Medical History  Diagnosis Date  . Palpitations     echo 2010  . Other abnormal glucose   . History of bulimia   . Depression   . Allergic rhinitis     Current Outpatient Prescriptions on File Prior to Visit  Medication Sig Dispense Refill  . FLUoxetine (PROZAC) 20 MG capsule TAKE ONE CAPSULE BY MOUTH ONE TIME DAILY  30 capsule  10    Past Surgical History  Procedure Date  . Hernia repair   . Repair of prolapsed rectum     Allergies  Allergen Reactions  . Penicillins   . Sulfonamide Derivatives     History   Social History  . Marital Status: Married    Spouse Name: N/A    Number of Children: N/A  . Years of Education: N/A   Occupational History  . Not on file.   Social History Main Topics  . Smoking status: Never Smoker   . Smokeless tobacco: Never Used  . Alcohol Use: Yes  . Drug Use: Not on file  . Sexually Active: Not on file   Other Topics Concern  . Not on file   Social History Narrative   [place] yesOwns a wine import companyhh  Of 3 3 dogs pet turtles    Family History  Problem Relation Age of Onset  . Alcohol abuse    . Colon cancer Mother   . Diabetes    . Other Son     had vsd that closed ?    Marland Kitchen Hypertension Father   . Heart attack Neg Hx   . Hyperlipidemia Neg Hx   . Sudden death Neg Hx     BP 101/44  Pulse 35  Temp(Src) 98 F (36.7 C) (Oral)  Ht 5\' 9"  (1.753 m)  Wt 165 lb (74.844 kg)  BMI 24.37 kg/m2  Review of Systems See HPI aboveo.    Objective:   Physical Exam Gen: NAD  L shoulder: No swelling, ecchymoses.  No gross deformity. No TTP. FROM with mild + painful arc. Positive Hawkins, Neers. Negative Speeds, Yergasons. Strength 5/5 with empty can and resisted internal/external rotation - mild pain with empty can. Negative apprehension. NV intact distally.  R shoulder: FROM without pain or weakness.    Assessment & Plan:  1. Left shoulder pain - 2/2 rotator cuff syndrome.  Excellent strength - doubt rotator cuff tear.  Start ibuprofen regularly x 7 days then as needed.  Shown home exercise program with theraband and scapular stabilization exercises.  Offered injection (stated at this point with pain improved I don't think it's absolutely necessary) - patient declined.  Icing, tylenol as needed.  Consider formal PT, injection, nitro patches if not improving as expected over next 4-6  weeks.

## 2011-12-24 ENCOUNTER — Ambulatory Visit (INDEPENDENT_AMBULATORY_CARE_PROVIDER_SITE_OTHER): Payer: BC Managed Care – PPO | Admitting: Sports Medicine

## 2011-12-24 VITALS — BP 100/60 | Ht 69.0 in | Wt 165.0 lb

## 2011-12-24 DIAGNOSIS — M2170 Unequal limb length (acquired), unspecified site: Secondary | ICD-10-CM

## 2011-12-24 DIAGNOSIS — M222X9 Patellofemoral disorders, unspecified knee: Secondary | ICD-10-CM

## 2011-12-24 DIAGNOSIS — M79609 Pain in unspecified limb: Secondary | ICD-10-CM

## 2011-12-24 DIAGNOSIS — M25569 Pain in unspecified knee: Secondary | ICD-10-CM

## 2011-12-24 NOTE — Progress Notes (Signed)
Subjective:     Patient ID: Joyce Jefferson, female   DOB: [birth year], 51 y.o.   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 She is currently training for a half marathon. She incorporates distance running and cross training into her routine. She reports that her heels pop out of the back of her shoe when she wears her custom orthotics. She has worn them 10 times in the past 2 years. She is more comfortable in her green sports insole shoe inserts. She was also experiencing metatarsal pain about 5 mi into her run which improved to 7 mi in with padded socks - only in newton shoes. She denies knee and hip pain.   Review of Systems As per HPI     Objective:   Physical Exam BP 100/60  Ht 5\' 9"  (1.753 m)  Wt 165 lb (74.844 kg)  BMI 24.37 kg/m2 General appearance: alert, cooperative and no distress Resp: normal work of breathing, speaks in full sentences.  Lower extremity:  R knee varus alignment with significant grinding along the lateral patella compared to the L.  5/5 quad strength. Negative patellofemoral compression test.  L leg 1.5 cm longer than R  Foot exam:  Rear foot change bilaterally  Preserved longitudinal arch bilaterally  Hallux Rigidus with bunion formation on the R.  Morton's foot.   Gait analysis: forefoot striker. Normal alignment without Trendelenburg.   Assessment and Plan:

## 2011-12-24 NOTE — Assessment & Plan Note (Signed)
She should continue using sports insole with a correction for the shorter right leg  These may be enough for her and she seems to get excellent relief  Her running form is excellent

## 2011-12-24 NOTE — Assessment & Plan Note (Addendum)
A: metatarsal foot pain related to leg length discrepancy and gait. Patient benefited more from shoe inserts than custom orthotics.  P: Provided her with another set of shoe inserts,  women's 9-10. With a medial lateral heel wedge on the R side to correct the leg length discrepancy. Also gave the patient the brochure needed to order additional inserts in the future.

## 2011-12-24 NOTE — Assessment & Plan Note (Signed)
This is much improved  She thinks as long as she uses good foot support she has minimal knee pain  Because she still has crepitation I would suggest crosstraining twice weekly on the bicycle

## 2012-02-20 ENCOUNTER — Other Ambulatory Visit: Payer: Self-pay | Admitting: Internal Medicine

## 2012-03-17 ENCOUNTER — Encounter: Payer: Self-pay | Admitting: Internal Medicine

## 2012-03-17 ENCOUNTER — Ambulatory Visit (INDEPENDENT_AMBULATORY_CARE_PROVIDER_SITE_OTHER): Payer: BC Managed Care – PPO | Admitting: Internal Medicine

## 2012-03-17 VITALS — BP 106/76 | HR 84 | Temp 98.7°F | Wt 173.0 lb

## 2012-03-17 DIAGNOSIS — F329 Major depressive disorder, single episode, unspecified: Secondary | ICD-10-CM

## 2012-03-17 DIAGNOSIS — Z8659 Personal history of other mental and behavioral disorders: Secondary | ICD-10-CM

## 2012-03-17 DIAGNOSIS — F3289 Other specified depressive episodes: Secondary | ICD-10-CM

## 2012-03-17 DIAGNOSIS — Z800 Family history of malignant neoplasm of digestive organs: Secondary | ICD-10-CM

## 2012-03-17 DIAGNOSIS — S39012A Strain of muscle, fascia and tendon of lower back, initial encounter: Secondary | ICD-10-CM | POA: Insufficient documentation

## 2012-03-17 DIAGNOSIS — IMO0002 Reserved for concepts with insufficient information to code with codable children: Secondary | ICD-10-CM

## 2012-03-17 MED ORDER — FLUOXETINE HCL 20 MG PO TABS: 20.0000 mg | ORAL_TABLET | Freq: Every day | ORAL | Status: DC

## 2012-03-17 NOTE — Progress Notes (Signed)
Chief Complaint  Patient presents with  . Follow-up    Meds.  Complains of back pain.    HPI: Pt comes in for med check.  Last visit over a year ago  Since then has done well   Irritable when tries to go off. Fluoxetine but takes about 3 x per week  Has been mostly on meds for 20 years.    Has genetic predspot  ansiety mood   Dec patience and annoying about things.  When goes off med  Ok to change  To tablet.    Inserts running  For ll discrep   Threw out back a few days ago.    Lifting tile boxes redoing br and reinjured when bent over  LBP lateral left more than right and no radiation worse with sitting bets with laying or standing .  Using ibuprofen  ROS: See pertinent positives and negatives per HPI.  Has hx of addd adhd in family and has been on med int he past per Dr Phillip Heal and asks about  [place] managing that problem with medication  Only takes med prn needed focus days    Mom passed from colon cancer  ? If utd on colonoscopy . Sees gyne f0r pap and mammo utd.   Past Medical History  Diagnosis Date  . Palpitations     echo 2010  . Other abnormal glucose   . History of bulimia   . Depression   . Allergic rhinitis     Family History  Problem Relation Age of Onset  . Alcohol abuse    . Colon cancer Mother   . Diabetes    . Other Son     had vsd that closed ?  Marland Kitchen Hypertension Father   . Heart attack Neg Hx   . Hyperlipidemia Neg Hx   . Sudden death Neg Hx     History   Social History  . Marital Status: Married    Spouse Name: N/A    Number of Children: N/A  . Years of Education: N/A   Social History Main Topics  . Smoking status: Never Smoker   . Smokeless tobacco: Never Used  . Alcohol Use: Yes  . Drug Use: None  . Sexually Active: None   Other Topics Concern  . 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 [DISCONTINUED] FLUoxetine (PROZAC) 20 MG capsule TAKE ONE CAPSULE BY MOUTH ONE TIME DAILY  30 capsule  10  . FLUoxetine (PROZAC) 20 MG tablet Take 1 tablet (20 mg total) by mouth daily.  90 tablet  3   No facility-administered encounter medications on file as of 03/17/2012.    EXAM:  BP 106/76  Pulse 84  Temp(Src) 98.7 F (37.1 C) (Oral)  Wt 173 lb (78.472 kg)  BMI 25.54 kg/m2  LMP 11/18/2010  Body mass index is 25.54 kg/(m^2).  GENERAL: vitals reviewed and listed above, alert, oriented, appears well hydrated and in no acute distress prefers to stand   Nl affect   HEENT: atraumatic, conjunctiva  clear, no obvious abnormalities on inspection of external nose and ears  MS: moves all extremities without noticeable focal  Abnormality  Points to ls area   No focal tenderness  Gait no change toe heel ok   PSYCH: pleasant and cooperative, no obvious depression or anxiety  ASSESSMENT AND PLAN:  Discussed the following assessment and plan:  DEPRESSION - stay on meds seem to do well considet truying 10 mg per day   Hx of attention deficit disorder - has seen dr Madaline Guthrie in the past and on meds prn   . make appt to discuss this problem and med hx  does have fam hx   Back strain, initial encounter - no alarm features  disc back hygeien ice stretch nsaid as needed  expectant management  Family history of colon cancer - mom deceased  had in initial neg colon call gi about repeat colonscopy  -Patient advised to return or notify health care team  if symptoms worsen or persist or new concerns arise.  Patient Instructions  Refill  Medication and can consider  Trying 10 mg per day .  Mammogram every 1-2 years. Make a visit about the ADD.   Back  Hygiene.    Back Exercises These exercises may help you when beginning to rehabilitate your injury. Your symptoms may resolve with or without further involvement from your physician,  physical therapist or athletic trainer. While completing these exercises, remember:   Restoring tissue flexibility helps normal motion to return to the joints. This allows healthier, less painful movement and activity.  An effective stretch should be held for at least 30 seconds.  A stretch should never be painful. You should only feel a gentle lengthening or release in the stretched tissue. STRETCH  Extension, Prone on Elbows   Lie on your stomach on the floor, a bed will be too soft. Place your palms about shoulder width apart and at the height of your head.  Place your elbows under your shoulders. If this is too painful, stack pillows under your chest.  Allow your body to relax so that your hips drop lower and make contact more completely with the floor.  Hold this position for __________ seconds.  Slowly return to lying flat on the floor. Repeat __________ times. Complete this exercise __________ times per day.  RANGE OF MOTION  Extension, Prone Press Ups   Lie on your stomach on the floor, a bed will be too soft. Place your palms about shoulder width apart and at the height of your head.  Keeping your back as relaxed as possible, slowly straighten your elbows while keeping your hips on the floor. You may adjust the placement of your hands to maximize your comfort. As you gain motion, your hands will come more underneath your shoulders.  Hold this position __________ seconds.  Slowly return to lying flat on the floor. Repeat __________ times. Complete this exercise __________ times per day.  RANGE OF MOTION- Quadruped, Neutral Spine   Assume a hands and knees position on a firm surface. Keep your hands under your shoulders and your knees under your hips. You may place padding under your knees for comfort.  Drop your head and point your tail bone toward the ground below you. This will round out your low back like an angry cat. Hold this position for __________ seconds.  Slowly  lift your head and release your tail bone so that your back sags into a large arch, like an old horse.  Hold this position for __________ seconds.  Repeat this until you feel limber in your low back.  Now, find your "sweet spot." This will be the most comfortable position somewhere between the  two previous positions. This is your neutral spine. Once you have found this position, tense your stomach muscles to support your low back.  Hold this position for __________ seconds. Repeat __________ times. Complete this exercise __________ times per day.  STRETCH  Flexion, Single Knee to Chest   Lie on a firm bed or floor with both legs extended in front of you.  Keeping one leg in contact with the floor, bring your opposite knee to your chest. Hold your leg in place by either grabbing behind your thigh or at your knee.  Pull until you feel a gentle stretch in your low back. Hold __________ seconds.  Slowly release your grasp and repeat the exercise with the opposite side. Repeat __________ times. Complete this exercise __________ times per day.  STRETCH - Hamstrings, Standing  Stand or sit and extend your right / left leg, placing your foot on a chair or foot stool  Keeping a slight arch in your low back and your hips straight forward.  Lead with your chest and lean forward at the waist until you feel a gentle stretch in the back of your right / left knee or thigh. (When done correctly, this exercise requires leaning only a small distance.)  Hold this position for __________ seconds. Repeat __________ times. Complete this stretch __________ times per day. STRENGTHENING  Deep Abdominals, Pelvic Tilt   Lie on a firm bed or floor. Keeping your legs in front of you, bend your knees so they are both pointed toward the ceiling and your feet are flat on the floor.  Tense your lower abdominal muscles to press your low back into the floor. This motion will rotate your pelvis so that your tail bone  is scooping upwards rather than pointing at your feet or into the floor.  With a gentle tension and even breathing, hold this position for __________ seconds. Repeat __________ times. Complete this exercise __________ times per day.  STRENGTHENING  Abdominals, Crunches   Lie on a firm bed or floor. Keeping your legs in front of you, bend your knees so they are both pointed toward the ceiling and your feet are flat on the floor. Cross your arms over your chest.  Slightly tip your chin down without bending your neck.  Tense your abdominals and slowly lift your trunk high enough to just clear your shoulder blades. Lifting higher can put excessive stress on the low back and does not further strengthen your abdominal muscles.  Control your return to the starting position. Repeat __________ times. Complete this exercise __________ times per day.  STRENGTHENING  Quadruped, Opposite UE/LE Lift   Assume a hands and knees position on a firm surface. Keep your hands under your shoulders and your knees under your hips. You may place padding under your knees for comfort.  Find your neutral spine and gently tense your abdominal muscles so that you can maintain this position. Your shoulders and hips should form a rectangle that is parallel with the floor and is not twisted.  Keeping your trunk steady, lift your right hand no higher than your shoulder and then your left leg no higher than your hip. Make sure you are not holding your breath. Hold this position __________ seconds.  Continuing to keep your abdominal muscles tense and your back steady, slowly return to your starting position. Repeat with the opposite arm and leg. Repeat __________ times. Complete this exercise __________ times per day. Document Released: 02/01/2005 Document Revised: 04/08/2011 Document Reviewed: 04/28/2008 [employer]  Patient Information 2013 [place], [place].      Neta Mends. Panosh M.D.

## 2012-03-17 NOTE — Patient Instructions (Addendum)
Refill  Medication and can consider  Trying 10 mg per day .  Mammogram every 1-2 years. Make a visit about the ADD.   Back  Hygiene.    Back Exercises These exercises may help you when beginning to rehabilitate your injury. Your symptoms may resolve with or without further involvement from your physician, physical therapist or athletic trainer. While completing these exercises, remember:   Restoring tissue flexibility helps normal motion to return to the joints. This allows healthier, less painful movement and activity.  An effective stretch should be held for at least 30 seconds.  A stretch should never be painful. You should only feel a gentle lengthening or release in the stretched tissue. STRETCH  Extension, Prone on Elbows   Lie on your stomach on the floor, a bed will be too soft. Place your palms about shoulder width apart and at the height of your head.  Place your elbows under your shoulders. If this is too painful, stack pillows under your chest.  Allow your body to relax so that your hips drop lower and make contact more completely with the floor.  Hold this position for __________ seconds.  Slowly return to lying flat on the floor. Repeat __________ times. Complete this exercise __________ times per day.  RANGE OF MOTION  Extension, Prone Press Ups   Lie on your stomach on the floor, a bed will be too soft. Place your palms about shoulder width apart and at the height of your head.  Keeping your back as relaxed as possible, slowly straighten your elbows while keeping your hips on the floor. You may adjust the placement of your hands to maximize your comfort. As you gain motion, your hands will come more underneath your shoulders.  Hold this position __________ seconds.  Slowly return to lying flat on the floor. Repeat __________ times. Complete this exercise __________ times per day.  RANGE OF MOTION- Quadruped, Neutral Spine   Assume a hands and knees position on a  firm surface. Keep your hands under your shoulders and your knees under your hips. You may place padding under your knees for comfort.  Drop your head and point your tail bone toward the ground below you. This will round out your low back like an angry cat. Hold this position for __________ seconds.  Slowly lift your head and release your tail bone so that your back sags into a large arch, like an old horse.  Hold this position for __________ seconds.  Repeat this until you feel limber in your low back.  Now, find your "sweet spot." This will be the most comfortable position somewhere between the two previous positions. This is your neutral spine. Once you have found this position, tense your stomach muscles to support your low back.  Hold this position for __________ seconds. Repeat __________ times. Complete this exercise __________ times per day.  STRETCH  Flexion, Single Knee to Chest   Lie on a firm bed or floor with both legs extended in front of you.  Keeping one leg in contact with the floor, bring your opposite knee to your chest. Hold your leg in place by either grabbing behind your thigh or at your knee.  Pull until you feel a gentle stretch in your low back. Hold __________ seconds.  Slowly release your grasp and repeat the exercise with the opposite side. Repeat __________ times. Complete this exercise __________ times per day.  STRETCH - Hamstrings, Standing  Stand or sit and extend your right /  left leg, placing your foot on a chair or foot stool  Keeping a slight arch in your low back and your hips straight forward.  Lead with your chest and lean forward at the waist until you feel a gentle stretch in the back of your right / left knee or thigh. (When done correctly, this exercise requires leaning only a small distance.)  Hold this position for __________ seconds. Repeat __________ times. Complete this stretch __________ times per day. STRENGTHENING  Deep Abdominals,  Pelvic Tilt   Lie on a firm bed or floor. Keeping your legs in front of you, bend your knees so they are both pointed toward the ceiling and your feet are flat on the floor.  Tense your lower abdominal muscles to press your low back into the floor. This motion will rotate your pelvis so that your tail bone is scooping upwards rather than pointing at your feet or into the floor.  With a gentle tension and even breathing, hold this position for __________ seconds. Repeat __________ times. Complete this exercise __________ times per day.  STRENGTHENING  Abdominals, Crunches   Lie on a firm bed or floor. Keeping your legs in front of you, bend your knees so they are both pointed toward the ceiling and your feet are flat on the floor. Cross your arms over your chest.  Slightly tip your chin down without bending your neck.  Tense your abdominals and slowly lift your trunk high enough to just clear your shoulder blades. Lifting higher can put excessive stress on the low back and does not further strengthen your abdominal muscles.  Control your return to the starting position. Repeat __________ times. Complete this exercise __________ times per day.  STRENGTHENING  Quadruped, Opposite UE/LE Lift   Assume a hands and knees position on a firm surface. Keep your hands under your shoulders and your knees under your hips. You may place padding under your knees for comfort.  Find your neutral spine and gently tense your abdominal muscles so that you can maintain this position. Your shoulders and hips should form a rectangle that is parallel with the floor and is not twisted.  Keeping your trunk steady, lift your right hand no higher than your shoulder and then your left leg no higher than your hip. Make sure you are not holding your breath. Hold this position __________ seconds.  Continuing to keep your abdominal muscles tense and your back steady, slowly return to your starting position. Repeat with the  opposite arm and leg. Repeat __________ times. Complete this exercise __________ times per day. Document Released: 02/01/2005 Document Revised: 04/08/2011 Document Reviewed: 04/28/2008 [facility] Patient Information 2013 [place], [place].

## 2012-03-18 ENCOUNTER — Encounter: Payer: Self-pay | Admitting: Internal Medicine

## 2012-08-26 ENCOUNTER — Ambulatory Visit (INDEPENDENT_AMBULATORY_CARE_PROVIDER_SITE_OTHER): Payer: BC Managed Care – PPO | Admitting: Emergency Medicine

## 2012-08-26 VITALS — BP 94/60 | Ht 69.0 in | Wt 165.0 lb

## 2012-08-26 DIAGNOSIS — S8990XA Unspecified injury of unspecified lower leg, initial encounter: Secondary | ICD-10-CM

## 2012-08-26 DIAGNOSIS — S8991XA Unspecified injury of right lower leg, initial encounter: Secondary | ICD-10-CM

## 2012-08-26 DIAGNOSIS — S8391XA Sprain of unspecified site of right knee, initial encounter: Secondary | ICD-10-CM | POA: Insufficient documentation

## 2012-08-26 NOTE — Progress Notes (Signed)
  Subjective:    Patient ID: Joyce Jefferson, female    DOB: 1960/09/11, 52 y.o.   MRN: [id number]  HPI This is a 52 year old female who presents to the sports medicine clinic today with the complaint of right knee pain she sustained an injury while running 2 weeks ago she tripped on some roots while trail running. After the fall she sustained an abrasion to her patella which is healed. She is having no pain or issues with that at this time. She also sustained pain after the injury to the medial side of her knee. She denies any swelling after the fall. She took a break from running. She has been doing some walking without pain or difficulty. Over the past several days she is noted that the pain on the medial side of her knee has disappeared. Currently she is comfortable short runs without pain but does occasionally notice the pain while rolling over at night in she's not taking any medications currently for this pain. She has plans for an extended hiking trip in one month wants to wanted to be sure that everything was okay with her knee for this trip.  She also brought with her today replacement green sole insoles and heel lifts to have heel lift put on her right insole.  Her prior insoles has started to wear out significantly. She uses these insoles while running and she feels that her foot slipped out of her should running shoes when she works for [employer].  Past medical family and social history reviewed and on chart. Review of Systems    reviewed review of systems is negative with the exception of what is noted in the history of present illness.  Objective:   Physical Exam BP 94/60  Ht 5\' 9"  (1.753 m)  Wt 165 lb (74.844 kg)  BMI 24.36 kg/m2  LMP 11/18/2010  Is a 52 year old woman in no acute distress awake alert and oriented x3   Right Knee: Normal to inspection with no erythema or effusion or obvious bony abnormalities.  Healing abrasion to right superomedial patellar  region. Palpation normal with no warmth or joint line tenderness or patellar tenderness or condyle tenderness. ROM normal in flexion and extension and lower leg rotation. Ligaments with solid consistent endpoints including ACL, PCL, LCL, MCL. Negative Mcmurray's and provocative meniscal tests. Non painful patellar compression. Patellar and quadriceps tendons unremarkable. Hamstring and quadriceps strength is normal.  Neurovascularly intact bilateral lower extremities with equal pulses and good sensation  She has a normal gait with no evidence of instability      Assessment & Plan:

## 2012-08-26 NOTE — Assessment & Plan Note (Signed)
Right knee injury secondary to at falling while running which has subsequently improved she requires no intervention at this time she may followup as needed

## 2012-09-02 LAB — HM COLONOSCOPY: HM Colonoscopy: NORMAL

## 2012-09-04 ENCOUNTER — Encounter: Payer: Self-pay | Admitting: Internal Medicine

## 2012-10-20 ENCOUNTER — Other Ambulatory Visit: Payer: Self-pay

## 2012-10-20 DIAGNOSIS — Z1231 Encounter for screening mammogram for malignant neoplasm of breast: Secondary | ICD-10-CM

## 2012-10-22 ENCOUNTER — Ambulatory Visit: Payer: BC Managed Care – PPO

## 2012-11-05 ENCOUNTER — Ambulatory Visit: Payer: BC Managed Care – PPO

## 2012-11-17 ENCOUNTER — Ambulatory Visit
Admission: RE | Admit: 2012-11-17 | Discharge: 2012-11-17 | Disposition: A | Discharge status: Home / Self Care | Payer: BC Managed Care – PPO | Source: Ambulatory Visit

## 2012-11-17 DIAGNOSIS — Z1231 Encounter for screening mammogram for malignant neoplasm of breast: Secondary | ICD-10-CM

## 2012-12-14 ENCOUNTER — Ambulatory Visit (INDEPENDENT_AMBULATORY_CARE_PROVIDER_SITE_OTHER): Payer: BC Managed Care – PPO | Admitting: Internal Medicine

## 2012-12-14 ENCOUNTER — Encounter: Payer: Self-pay | Admitting: Internal Medicine

## 2012-12-14 VITALS — BP 110/70 | HR 66 | Temp 98.1°F | Wt 164.0 lb

## 2012-12-14 DIAGNOSIS — R7309 Other abnormal glucose: Secondary | ICD-10-CM

## 2012-12-14 DIAGNOSIS — Z299 Encounter for prophylactic measures, unspecified: Secondary | ICD-10-CM

## 2012-12-14 DIAGNOSIS — Z833 Family history of diabetes mellitus: Secondary | ICD-10-CM

## 2012-12-14 DIAGNOSIS — M79671 Pain in right foot: Secondary | ICD-10-CM

## 2012-12-14 DIAGNOSIS — M79609 Pain in unspecified limb: Secondary | ICD-10-CM

## 2012-12-14 NOTE — Patient Instructions (Signed)
I think the foot problem is a local problem. And not diabetic or neuropathy.   We can do  Fasting labs to make sure and hg a1c.

## 2012-12-14 NOTE — Progress Notes (Signed)
Chief Complaint  Patient presents with  . Foot Pain    Ongoing for several months.  Pain is becoming more frequent.    HPI: Patient comes in today for SDA for  new problem evaluation. Off an on for a time  But worse recnetly after trip to [facility] does get sciatica right  Runs no injury has LL discrp and inserts   Pt needs opinion as she feels"Paranoia about diabetes" cause of family issues and has sweet tooth.  Looked on line.  And got concerned about PN>  continues to run has hx of PF  ROS: See pertinent positives and negatives per HPI. Neg hx of gest dm but had a 10 # baby   Past Medical History  Diagnosis Date  . Palpitations     echo 2010  . Other abnormal glucose   . History of bulimia   . Depression   . Allergic rhinitis     Family History  Problem Relation Age of Onset  . Alcohol abuse    . Colon cancer Mother   . Diabetes    . Other Son     had vsd that closed ?  Marland Kitchen Hypertension Father   . Heart attack Neg Hx   . Hyperlipidemia Neg Hx   . Sudden death Neg Hx     History   Social History  . Marital Status: Married    Spouse Name: N/A    Number of Children: N/A  . Years of Education: N/A   Social History Main Topics  . Smoking status: Never Smoker   . Smokeless tobacco: Never Used  . Alcohol Use: Yes  . Drug Use: None  . Sexual Activity: None   Other Topics Concern  . 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 FLUoxetine (PROZAC) 20 MG tablet Take 1 tablet (20 mg total) by mouth daily.    EXAM:  BP 110/70  Pulse 66  Temp(Src) 98.1 F (36.7 C) (Oral)  Wt 164 lb (74.39 kg)  SpO2 98%  LMP 11/18/2010  Body mass index is 24.21 kg/(m^2).  GENERAL: vitals reviewed and listed above, alert, oriented, appears well hydrated and in no acute distress HEENT: atraumatic,  conjunctiva  clear, no obvious abnormalities on inspection of external nose and ears  MS: moves all extremities without noticeable focal  abnormality Feet  Nl pulses  And position  No atrophy  sens to 10 g mono filament intact  dtrs symmetric  PSYCH: pleasant and cooperative, no obvious depression or anxiety  ASSESSMENT AND PLAN:  Discussed the following assessment and plan:  Foot pain, bilateral - Plan: Basic metabolic panel, CBC with Differential, Hemoglobin A1c, Hepatic function panel, Lipid panel, TSH  Preventive measure - Plan: Basic metabolic panel, CBC with Differential, Hemoglobin A1c, Hepatic function panel, Lipid panel, TSH  Family history of diabetes mellitus - Plan: Basic metabolic panel, CBC with Differential, Hemoglobin A1c, Hepatic function panel, Lipid panel, TSH  HYPERGLYCEMIA, MILD - Plan: Basic metabolic panel, CBC with Differential, Hemoglobin A1c, Hepatic function panel, Lipid panel, TSH Issues seem mechanical  And reassuring but reasonable to check labs cause of age and last labs over 2 years ago . Get a1c etc cpx labs  -Patient advised to return or notify  health care team  if symptoms worsen or persist or new concerns arise.  Patient Instructions  I think the foot problem is a local problem. And not diabetic or neuropathy.   We can do  Fasting labs to make sure and hg a1c.   Neta Mends. Joyce Jefferson M.D.

## 2012-12-18 ENCOUNTER — Other Ambulatory Visit (INDEPENDENT_AMBULATORY_CARE_PROVIDER_SITE_OTHER): Payer: BC Managed Care – PPO

## 2012-12-18 DIAGNOSIS — M79671 Pain in right foot: Secondary | ICD-10-CM

## 2012-12-18 DIAGNOSIS — Z833 Family history of diabetes mellitus: Secondary | ICD-10-CM

## 2012-12-18 DIAGNOSIS — M79609 Pain in unspecified limb: Secondary | ICD-10-CM

## 2012-12-18 DIAGNOSIS — Z299 Encounter for prophylactic measures, unspecified: Secondary | ICD-10-CM

## 2012-12-18 DIAGNOSIS — R7309 Other abnormal glucose: Secondary | ICD-10-CM

## 2012-12-18 LAB — LIPID PANEL
Cholesterol: 195 mg/dL (ref 0–200)
HDL: 62.8 mg/dL (ref >39.00)
LDL Cholesterol: 126 mg/dL — ABNORMAL HIGH (ref 0–99)
VLDL: 6.4 mg/dL (ref 0.0–40.0)

## 2012-12-18 LAB — BASIC METABOLIC PANEL
BUN: 12 mg/dL (ref 6–23)
CO2: 31 mEq/L (ref 19–32)
Calcium: 9.3 mg/dL (ref 8.4–10.5)
Creatinine, Ser: 0.8 mg/dL (ref 0.4–1.2)
GFR: 77.91 mL/min (ref >60.00)
Glucose, Bld: 93 mg/dL (ref 70–99)
Potassium: 4 mEq/L (ref 3.5–5.1)

## 2012-12-18 LAB — HEPATIC FUNCTION PANEL
ALT: 14 U/L (ref 0–35)
AST: 17 U/L (ref 0–37)
Albumin: 4 g/dL (ref 3.5–5.2)
Alkaline Phosphatase: 38 U/L — ABNORMAL LOW (ref 39–117)
Bilirubin, Direct: 0.2 mg/dL (ref 0.0–0.3)
Total Bilirubin: 1.4 mg/dL — ABNORMAL HIGH (ref 0.3–1.2)

## 2012-12-18 LAB — CBC WITH DIFFERENTIAL/PLATELET
Basophils Absolute: 0 10*3/uL (ref 0.0–0.1)
Basophils Relative: 0.6 % (ref 0.0–3.0)
Eosinophils Relative: 2.8 % (ref 0.0–5.0)
HCT: 39.1 % (ref 36.0–46.0)
Hemoglobin: 13.5 g/dL (ref 12.0–15.0)
Lymphocytes Relative: 36.3 % (ref 12.0–46.0)
Lymphs Abs: 1.4 10*3/uL (ref 0.7–4.0)
MCHC: 34.6 g/dL (ref 30.0–36.0)
MCV: 86 fl (ref 78.0–100.0)
Monocytes Relative: 7.3 % (ref 3.0–12.0)
Neutro Abs: 2 10*3/uL (ref 1.4–7.7)
Neutrophils Relative %: 53 % (ref 43.0–77.0)
Platelets: 199 10*3/uL (ref 150.0–400.0)
RDW: 13.4 % (ref 11.5–14.6)
WBC: 3.7 10*3/uL — ABNORMAL LOW (ref 4.5–10.5)

## 2012-12-18 LAB — HEMOGLOBIN A1C: Hgb A1c MFr Bld: 5.4 % (ref 4.6–6.5)

## 2012-12-18 LAB — TSH: TSH: 1.54 u[IU]/mL (ref 0.35–5.50)

## 2012-12-21 ENCOUNTER — Encounter: Payer: Self-pay | Admitting: Family Medicine

## 2012-12-21 ENCOUNTER — Telehealth: Payer: Self-pay | Admitting: Family Medicine

## 2012-12-21 NOTE — Telephone Encounter (Signed)
Called the pt this morning to tell her about lab results.  Pt called back and left a message on my machine.  She requested all information be sent by mail or email.  Will send a letter and a copy of the labs by mail.

## 2013-04-15 ENCOUNTER — Telehealth: Payer: Self-pay | Admitting: Internal Medicine

## 2013-04-15 ENCOUNTER — Ambulatory Visit (INDEPENDENT_AMBULATORY_CARE_PROVIDER_SITE_OTHER): Payer: BC Managed Care – PPO | Admitting: Family Medicine

## 2013-04-15 ENCOUNTER — Encounter: Payer: Self-pay | Admitting: Family Medicine

## 2013-04-15 VITALS — BP 100/70 | Temp 98.8°F | Wt 166.0 lb

## 2013-04-15 DIAGNOSIS — K589 Irritable bowel syndrome without diarrhea: Secondary | ICD-10-CM

## 2013-04-15 DIAGNOSIS — K5900 Constipation, unspecified: Secondary | ICD-10-CM

## 2013-04-15 MED ORDER — LINACLOTIDE 145 MCG PO CAPS: 145.0000 ug | ORAL_CAPSULE | Freq: Every day | ORAL | Status: DC

## 2013-04-15 NOTE — Progress Notes (Signed)
Chief Complaint  Patient presents with  . Constipation    HPI:  Constipation: -chronic constipation "her whole life" -symptoms: straining with BMs, daily BM, stools are hard small pellets, occ bloating when constipation is bad, hemorrhoids -in the past tried: regular exercise, had tried some things over the counter that did not work -denies: weight loss, diarrhea, abdominal or pelvic pain, blood in stools, had screening colonoscopy recently and was normal  -took linzess a few times and liked it -has family hx of ibs and thinks she has this  -has tried fiber supplement and mirilax   ROS: See pertinent positives and negatives per HPI.  Past Medical History  Diagnosis Date  . Palpitations     echo 2010  . Other abnormal glucose   . History of bulimia   . Depression   . Allergic rhinitis     Past Surgical History  Procedure Laterality Date  . Hernia repair    . Repair of prolapsed rectum      Family History  Problem Relation Age of Onset  . Alcohol abuse    . Colon cancer Mother   . Diabetes    . Other Son     had vsd that closed ?  Marland Kitchen. Hypertension Father   . Heart attack Neg Hx   . Hyperlipidemia Neg Hx   . Sudden death Neg Hx     History   Social History  . Marital Status: Married    Spouse Name: N/A    Number of Children: N/A  . Years of Education: N/A   Social History Main Topics  . Smoking status: Never Smoker   . Smokeless tobacco: Never Used  . Alcohol Use: Yes  . Drug Use: None  . Sexual Activity: None   Other Topics Concern  . None   Social History Narrative   Lives in [place]   Married   Regular exercise- yes   Owns a wine import company   hh  Of 3    3 dogs pet turtles    Current outpatient prescriptions:FLUoxetine (PROZAC) 20 MG tablet, Take 1 tablet (20 mg total) by mouth daily., Disp: 90 tablet, Rfl: 3;  Linaclotide (LINZESS) 145 MCG CAPS capsule, Take 1 capsule (145 mcg total) by mouth daily., Disp: 30 capsule, Rfl:  0  EXAM:  Filed Vitals:   04/15/13 1556  BP: 100/70  Temp: 98.8 F (37.1 C)    Body mass index is 24.5 kg/(m^2).  GENERAL: vitals reviewed and listed above, alert, oriented, appears well hydrated and in no acute distress  HEENT: atraumatic, conjunttiva clear, no obvious abnormalities on inspection of external nose and ears  NECK: no obvious masses on inspection  LUNGS: clear to auscultation bilaterally, no wheezes, rales or rhonchi, good air movement  CV: HRRR, no peripheral edema  ABD: BS+, soft, NTTP  MS: moves all extremities without noticeable abnormality  PSYCH: pleasant and cooperative, no obvious depression or anxiety  ASSESSMENT AND PLAN:  Discussed the following assessment and plan:  IBS (irritable bowel syndrome) - Plan: Linaclotide (LINZESS) 145 MCG CAPS capsule  Constipation - Plan: Linaclotide (LINZESS) 145 MCG CAPS capsule  -chronic constipation and IBS -c type symptoms, dicussed other pot etiologies, tx, has tried OTC medications without relief for years, UTD on colonoscopy without alarm features -wants trial of low dose lizess - discussed risks -follow up with PCP in 1 month -Patient advised to return or notify a doctor immediately if symptoms worsen or persist or new concerns arise.  There  are no Patient Instructions on file for this visit.   Colin Benton R.

## 2013-04-15 NOTE — Telephone Encounter (Signed)
I would need  Summary note from psychiatrist about diagnosis and medication management  reccommendations before deciding if we can take over this care.   Have this sent to us before making an appt

## 2013-04-15 NOTE — Patient Instructions (Signed)
-  As we discussed, we have prescribed a new medication for you at this appointment. We discussed the common and serious potential adverse effects of this medication and you can review these and more with the pharmacist when you pick up your medication.  Please follow the instructions for use carefully and notify us immediately if you have any problems taking this medication.  -follow up with your doctor in 1 month

## 2013-04-15 NOTE — Telephone Encounter (Signed)
Pt would like to come in and discuss you prescribing her add med. Pt had been getting low dose occasional from psychiatrist, but wants to speak w/ you about getting on all the time.  No 30 min appt for a few weeks, pls advise.

## 2013-04-15 NOTE — Progress Notes (Signed)
Pre visit review using our clinic review tool, if applicable. No additional management support is needed unless otherwise documented below in the visit note. 

## 2013-04-19 NOTE — Telephone Encounter (Signed)
Per WP in below note.  She will need the summary note from psychiatrist about a diagnosis and medication management recommendations before deciding if she can take over care. Have this sent to us before making an appt.

## 2013-04-19 NOTE — Telephone Encounter (Signed)
lmom for pt to cb w/ name of psychiatrist per dr Fabian Sharppanosh .

## 2013-04-19 NOTE — Telephone Encounter (Signed)
Do I need to schedule appt for this pt now? Do you have the notes already or do we need to get them? Thanks!

## 2013-04-19 NOTE — Telephone Encounter (Deleted)
I would need  Summary note from psychiatrist about diagnosis and medication management  reccommendations before deciding if we can take over this care.   Have this sent to us before making an appt 

## 2013-05-17 ENCOUNTER — Telehealth: Payer: Self-pay | Admitting: Internal Medicine

## 2013-05-17 NOTE — Telephone Encounter (Signed)
Patient must make an OV before this can be refilled.  Please contact the pt and make an appt. Send this message back to me to refill once appt has been made. Thanks!

## 2013-05-17 NOTE — Telephone Encounter (Signed)
[id number] - [place],  - [id number] is requesting re-fill on FLUoxetine (PROZAC) 20 MG tablet

## 2013-05-17 NOTE — Telephone Encounter (Addendum)
Needs ROV fo rmedication evaluation  Can refill x 1 month only  Pending OV

## 2013-05-17 NOTE — Telephone Encounter (Signed)
No upcoming appt

## 2013-05-18 NOTE — Telephone Encounter (Signed)
LMOM for pt to schedule med check OV.

## 2013-05-26 MED ORDER — FLUOXETINE HCL 20 MG PO TABS: 20.0000 mg | ORAL_TABLET | Freq: Every day | ORAL | Status: DC

## 2013-05-26 NOTE — Telephone Encounter (Signed)
Medication sent to the pharmacy by e-scribe. 

## 2013-05-26 NOTE — Telephone Encounter (Signed)
Pt has appt sch for 06-14-13

## 2013-05-27 ENCOUNTER — Telehealth: Payer: Self-pay | Admitting: Internal Medicine

## 2013-05-27 MED ORDER — FLUOXETINE HCL 20 MG PO CAPS: 20.0000 mg | ORAL_CAPSULE | Freq: Every day | ORAL | Status: DC

## 2013-05-27 NOTE — Telephone Encounter (Signed)
New Rx sent to the pharmacy

## 2013-05-27 NOTE — Telephone Encounter (Signed)
Joyce CornfieldStephanie from [facility] call to see if she could dispense capsules FLUoxetine (PROZAC) 20 MG tablet instead of the tablets it cheaper for the pt    PHONE NUMBER; [phone]

## 2013-06-14 ENCOUNTER — Telehealth: Payer: Self-pay | Admitting: Family Medicine

## 2013-06-14 ENCOUNTER — Ambulatory Visit (INDEPENDENT_AMBULATORY_CARE_PROVIDER_SITE_OTHER): Payer: Self-pay | Admitting: Internal Medicine

## 2013-06-14 ENCOUNTER — Encounter: Payer: Self-pay | Admitting: Internal Medicine

## 2013-06-14 NOTE — Telephone Encounter (Signed)
Patient missed appt with Dr. Fabian SharpPanosh today.  Called her name twice from the lobby.  Patient did not respond.  Left a message on her personally identified cell to call the office and reschedule.

## 2013-06-14 NOTE — Progress Notes (Signed)
No show

## 2013-08-24 ENCOUNTER — Encounter: Payer: Self-pay | Admitting: Internal Medicine

## 2013-08-24 ENCOUNTER — Ambulatory Visit (INDEPENDENT_AMBULATORY_CARE_PROVIDER_SITE_OTHER): Payer: BC Managed Care – PPO | Admitting: Internal Medicine

## 2013-08-24 VITALS — BP 100/60 | Temp 98.4°F | Wt 152.0 lb

## 2013-08-24 DIAGNOSIS — F3289 Other specified depressive episodes: Secondary | ICD-10-CM

## 2013-08-24 DIAGNOSIS — F329 Major depressive disorder, single episode, unspecified: Secondary | ICD-10-CM

## 2013-08-24 DIAGNOSIS — Z79899 Other long term (current) drug therapy: Secondary | ICD-10-CM

## 2013-08-24 MED ORDER — FLUOXETINE HCL 20 MG PO CAPS: 20.0000 mg | ORAL_CAPSULE | Freq: Every day | ORAL | Status: DC

## 2013-08-24 NOTE — Patient Instructions (Signed)
Suggest not too uneven .  Dosing  usually makes feel better .  Let us know if you want to try a different dose . ( no ov needed)  Labs done last year were ok .   Healthy lifestyle includes : At least 150 minutes of exercise weeks  , weight at healthy levels, which is usually   BMI 19-25. Avoid trans fats and processed foods;  Increase fresh fruits and veges to 5 servings per day. And avoid sweet beverages including tea and juice. Mediterranean diet with olive oil and nuts have been noted to be heart and brain healthy . Avoid tobacco products . Limit  alcohol to  7 per week for women and 14 servings for men.  Get adequate sleep .

## 2013-08-24 NOTE — Progress Notes (Signed)
Pre visit review using our clinic review tool, if applicable. No additional management support is needed unless otherwise documented below in the visit note.  Chief Complaint  Patient presents with  . Follow-up    meds    HPI:  Fu yearly visit med evaluation  taking prozac  Taking 2-3 x per week.  Hard to wean.? Gets irritable but wants to prob stay on at this time  Well other wise .   Tablets very expensive compared to capsules  So cant really cut in 1/2 so taking less often. No sig se    Net tobacco    Wine  1-2 per day No sugar beverages .water Exercise  Every day.  Runs without most difficulty  Caffiene. ocass waves .  Fel utd on HCM ROS: See pertinent positives and negatives per HPI.  Past Medical History  Diagnosis Date  . Palpitations     echo 2010  . Other abnormal glucose   . History of bulimia   . Depression   . Allergic rhinitis     Family History  Problem Relation Age of Onset  . Alcohol abuse    . Colon cancer Mother   . Diabetes    . Other Son     had vsd that closed ?  Marland Kitchen. Hypertension Father   . Heart attack Neg Hx   . Hyperlipidemia Neg Hx   . Sudden death Neg Hx     History   Social History  . Marital Status: Married    Spouse Name: N/A    Number of Children: N/A  . Years of Education: N/A   Social History Main Topics  . Smoking status: Never Smoker   . Smokeless tobacco: Never Used  . Alcohol Use: Yes  . Drug Use: None  . Sexual Activity: None   Other Topics Concern  . None   Social History Narrative   [place]ives in [place]   Married   Regular exercise- yes   Owns a wine import company   hh  Of 3    3 dogs pet turtles    Outpatient Encounter Prescriptions as of 08/24/2013  Medication Sig  . [DISCONTINUED] FLUoxetine (PROZAC) 20 MG capsule Take 1 capsule (20 mg total) by mouth daily.  Marland Kitchen. FLUoxetine (PROZAC) 20 MG capsule Take 1 capsule (20 mg total) by mouth daily.  . [DISCONTINUED] Linaclotide (LINZESS) 145 MCG CAPS capsule  Take 1 capsule (145 mcg total) by mouth daily.    EXAM:  BP 100/60  Temp(Src) 98.4 F (36.9 C) (Oral)  Wt 152 lb (68.947 kg)  LMP 11/18/2010  Body mass index is 22.44 kg/(m^2).  GENERAL: vitals reviewed and listed above, alert, oriented, appears well hydrated and in no acute distress HEENT: atraumatic, conjunctiva  clear, no obvious abnormalities on inspection of external nose and ears OP : no lesion edema or exudate  NECK: no obvious masses on inspection palpation  LUNGS: clear to auscultation bilaterally, no wheezes, rales or rhonchi, good air movement CV: HRRR, no clubbing cyanosis or  peripheral edema nl cap refill  Abdomen:  Sof,t normal bowel sounds without hepatosplenomegaly, no guarding rebound or masses no CVA tenderness MS: moves all extremities without noticeable focal  abnormality PSYCH: pleasant and cooperative, no obvious depression or anxiety Lab Results  Component Value Date   WBC 3.7* 12/18/2012   HGB 13.5 12/18/2012   HCT 39.1 12/18/2012   PLT 199.0 12/18/2012   GLUCOSE 93 12/18/2012   CHOL 195 12/18/2012   TRIG 32.0  12/18/2012   HDL 62.80 12/18/2012   LDLCALC 126* 12/18/2012   ALT 14 12/18/2012   AST 17 12/18/2012   NA 137 12/18/2012   K 4.0 12/18/2012   CL 102 12/18/2012   CREATININE 0.8 12/18/2012   BUN 12 12/18/2012   CO2 31 12/18/2012   TSH 1.54 12/18/2012   HGBA1C 5.4 12/18/2012   Wt Readings from Last 3 Encounters:  08/24/13 152 lb (68.947 kg)  04/15/13 166 lb (75.297 kg)  12/14/12 164 lb (74.39 kg)     ASSESSMENT AND PLAN:  Discussed the following assessment and plan:  Depressive disorder, not elsewhere classified - doing well disc weanign optinos will call if needed to rx for 10 mg othewise yearly check   Medication management  -Patient advised to return or notify health care team  if symptoms worsen ,persist or new concerns arise.  Patient Instructions  Suggest not too uneven .  Dosing  usually makes feel better .  Let us know  if you want to try a different dose . ( no ov needed)  Labs done last year were ok .   Healthy lifestyle includes : At least 150 minutes of exercise weeks  , weight at healthy levels, which is usually   BMI 19-25. Avoid trans fats and processed foods;  Increase fresh fruits and veges to 5 servings per day. And avoid sweet beverages including tea and juice. Mediterranean diet with olive oil and nuts have been noted to be heart and brain healthy . Avoid tobacco products . Limit  alcohol to  7 per week for women and 14 servings for men.  Get adequate sleep .        Neta Mends. Darelle Kings M.D.

## 2013-09-22 ENCOUNTER — Ambulatory Visit (INDEPENDENT_AMBULATORY_CARE_PROVIDER_SITE_OTHER): Payer: BC Managed Care – PPO | Admitting: Internal Medicine

## 2013-09-22 ENCOUNTER — Encounter: Payer: Self-pay | Admitting: Internal Medicine

## 2013-09-22 VITALS — BP 96/58 | Temp 98.5°F | Ht 69.0 in | Wt 150.0 lb

## 2013-09-22 DIAGNOSIS — R41840 Attention and concentration deficit: Secondary | ICD-10-CM

## 2013-09-22 DIAGNOSIS — Z23 Encounter for immunization: Secondary | ICD-10-CM

## 2013-09-22 DIAGNOSIS — R229 Localized swelling, mass and lump, unspecified: Secondary | ICD-10-CM

## 2013-09-22 DIAGNOSIS — Z8659 Personal history of other mental and behavioral disorders: Secondary | ICD-10-CM

## 2013-09-22 NOTE — Progress Notes (Signed)
Pre visit review using our clinic review tool, if applicable. No additional management support is needed unless otherwise documented below in the visit note.  Chief Complaint  Patient presents with  . Mass    Has a mass/lump over her sternum and below her breasts.  First noticed 1-2 months ago.  Also has similiar areas on her jaw and left leg.  Has been told this could be possible lipomas.  She is concerned as this new one is near her breasts.    HPI: Patient comes in today for SDA for  new problem evaluation. 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 No specific breast lumps  Also wanted to bring up possibility of going on ADD medication. She has 3 children with ADD doesn't feel depressed see past history  32  age was Dx with add  I Psych and  Psychologist .  Not depression in [place]  [facility] [place] .  Is given what she thinks was Adderall. She states that at this time she is having more problems focusing helping her children and is affecting her functioning. Asks about going back on medicine.  ROS: See pertinent positives and negatives per HPI. No current chest pain shortness of breath  Past Medical History  Diagnosis Date  . Palpitations     echo 2010  . Other abnormal glucose   . History of bulimia   . Depression   . Allergic rhinitis     Family History  Problem Relation Age of Onset  . Alcohol abuse    . Colon cancer Mother   . Diabetes    . Other Son     had vsd that closed ?  Marland Kitchen Hypertension Father   . Heart attack Neg Hx   . Hyperlipidemia Neg Hx   . Sudden death Neg Hx     History   Social History  . Marital Status: Married    Spouse Name: N/A    Number of Children: N/A  . Years of Education: N/A   Social History Main Topics  .  Smoking status: Never Smoker   . Smokeless tobacco: Never Used  . Alcohol Use: Yes  . Drug Use: None  . Sexual Activity: None   Other Topics Concern  . None   Social History Narrative   Lives in [place]   Married   Regular exercise- yes   Owns a wine import company   hh  Of 3    3 dogs pet turtles    Outpatient Encounter Prescriptions as of 09/22/2013  Medication Sig  . FLUoxetine (PROZAC) 20 MG capsule Take 1 capsule (20 mg total) by mouth daily.    EXAM:  BP 96/58  Temp(Src) 98.5 F (36.9 C) (Oral)  Ht  (1.753 m)  Wt 150 lb (68.04 kg)  BMI 22.14 kg/m2  LMP 11/18/2010  Body mass index is 22.14 kg/(m^2).  GENERAL: vitals reviewed and listed above, alert, oriented, appears well hydrated and in no acute distress HEENT: atraumatic, conjunctiva  clear, no obvious abnormalities on inspection of external nose and earsChest wall normal there is a small BB sized subcutaneous attached to skin cystic nodule around the xiphoid not attached to the bone and not related to the breast. It is nontender  and I don't see a center she is a marble sized mobile nodule left thigh palpable only CV: HRRR, no clubbing cyanosis or  peripheral edema nl cap refill  MS: moves all extremities without noticeable focal  abnormality PSYCH: pleasant and cooperative, no obvious depression or anxiety  ASSESSMENT AND PLAN:  Discussed the following assessment and plan:  Lump of skin - Probably a skin cyst not concerned observed followup if progressive in any way. Patient reassured  Need for prophylactic vaccination and inoculation against influenza - Plan: Flu Vaccine QUAD 36+ mos PF IM (Fluarix Quad PF)  Attention and concentration deficit - Apparent history of previous evaluation in 3 children is with same device evaluation Dr. Elbert Ewings. to back and then followup about poss interventions. - Plan: Ambulatory referral to Psychology  Hx of attention deficit disorder  -Patient advised to return or notify  health care team  if symptoms worsen ,persist or new concerns arise.  Patient Instructions  No problem  With lump follow .  Get evaluation with dr Reggy Eye and then fu visit after report back.  To discuss intervention.   Neta Mends. Angelyse Heslin M.D.

## 2013-09-22 NOTE — Patient Instructions (Signed)
No problem  With lump follow .  Get evaluation with dr Reggy Eye and then fu visit after report back.  To discuss intervention.

## 2013-11-15 ENCOUNTER — Other Ambulatory Visit: Payer: Self-pay

## 2013-11-15 DIAGNOSIS — Z1231 Encounter for screening mammogram for malignant neoplasm of breast: Secondary | ICD-10-CM

## 2013-11-23 ENCOUNTER — Telehealth: Payer: Self-pay | Admitting: Internal Medicine

## 2013-11-23 ENCOUNTER — Ambulatory Visit
Admission: RE | Admit: 2013-11-23 | Discharge: 2013-11-23 | Disposition: A | Discharge status: Home / Self Care | Payer: BC Managed Care – PPO | Source: Ambulatory Visit

## 2013-11-23 DIAGNOSIS — Z1231 Encounter for screening mammogram for malignant neoplasm of breast: Secondary | ICD-10-CM

## 2013-11-23 NOTE — Telephone Encounter (Signed)
As per last check ok to rx  prozac 10 mg 1  Po qd or as directed .wean. Disp 90 refill x 2

## 2013-11-23 NOTE — Telephone Encounter (Signed)
Pt called to say that she would like to go back to 10 mg on the following medcine  FLUoxetine (PROZAC) 20 MG capsule    Pharmacy ; [facility] [address]

## 2013-11-24 MED ORDER — FLUOXETINE HCL (PMDD) 10 MG PO TABS: 1.0000 | ORAL_TABLET | Freq: Every day | ORAL | Status: DC

## 2013-11-24 NOTE — Telephone Encounter (Signed)
Patient notified to pick up at the pharmacy. 

## 2013-11-24 NOTE — Telephone Encounter (Signed)
Sent to the pharmacy by e-scribe. 

## 2014-01-06 ENCOUNTER — Telehealth: Payer: Self-pay | Admitting: Internal Medicine

## 2014-01-06 DIAGNOSIS — R41840 Attention and concentration deficit: Secondary | ICD-10-CM

## 2014-01-06 NOTE — Telephone Encounter (Signed)
Pt states at last visit she had discussed w/ WP getting on ADD med. Pt states dr Fabian Sharppanosh was going to refer her to someone here for testing. [place] 3 [place] and thinks someone may have called during this time w/ that info. Pt would like a cb w/ that person's name .

## 2014-01-06 NOTE — Telephone Encounter (Signed)
Order placed in the system. 

## 2014-01-24 ENCOUNTER — Encounter: Payer: Self-pay | Admitting: Internal Medicine

## 2014-01-24 ENCOUNTER — Ambulatory Visit (INDEPENDENT_AMBULATORY_CARE_PROVIDER_SITE_OTHER): Payer: BC Managed Care – PPO | Admitting: Internal Medicine

## 2014-01-24 VITALS — BP 102/72 | Temp 98.0°F | Ht 69.0 in | Wt 153.5 lb

## 2014-01-24 DIAGNOSIS — E538 Deficiency of other specified B group vitamins: Secondary | ICD-10-CM

## 2014-01-24 DIAGNOSIS — R200 Anesthesia of skin: Secondary | ICD-10-CM

## 2014-01-24 DIAGNOSIS — R41840 Attention and concentration deficit: Secondary | ICD-10-CM

## 2014-01-24 LAB — BASIC METABOLIC PANEL
BUN: 13 mg/dL (ref 6–23)
CALCIUM: 9.7 mg/dL (ref 8.4–10.5)
CO2: 31 mEq/L (ref 19–32)
CREATININE: 0.9 mg/dL (ref 0.4–1.2)
Chloride: 102 mEq/L (ref 96–112)
GFR: 67.09 mL/min (ref >60.00)
GLUCOSE: 99 mg/dL (ref 70–99)
Potassium: 5.3 mEq/L — ABNORMAL HIGH (ref 3.5–5.1)
SODIUM: 140 meq/L (ref 135–145)

## 2014-01-24 LAB — CBC WITH DIFFERENTIAL/PLATELET
Basophils Absolute: 0 10*3/uL (ref 0.0–0.1)
Basophils Relative: 0.4 % (ref 0.0–3.0)
EOS PCT: 1.5 % (ref 0.0–5.0)
Eosinophils Absolute: 0.1 10*3/uL (ref 0.0–0.7)
HEMATOCRIT: 40.5 % (ref 36.0–46.0)
HEMOGLOBIN: 13.3 g/dL (ref 12.0–15.0)
LYMPHS ABS: 1.2 10*3/uL (ref 0.7–4.0)
Lymphocytes Relative: 31.9 % (ref 12.0–46.0)
MCHC: 32.8 g/dL (ref 30.0–36.0)
MCV: 90.4 fl (ref 78.0–100.0)
MONOS PCT: 6.9 % (ref 3.0–12.0)
Monocytes Absolute: 0.3 10*3/uL (ref 0.1–1.0)
NEUTROS ABS: 2.3 10*3/uL (ref 1.4–7.7)
Neutrophils Relative %: 59.3 % (ref 43.0–77.0)
Platelets: 196 10*3/uL (ref 150.0–400.0)
RBC: 4.48 Mil/uL (ref 3.87–5.11)
RDW: 13.1 % (ref 11.5–15.5)
WBC: 3.9 10*3/uL — AB (ref 4.0–10.5)

## 2014-01-24 LAB — T4, FREE: Free T4: 0.82 ng/dL (ref 0.60–1.60)

## 2014-01-24 LAB — VITAMIN B12: VITAMIN B 12: 149 pg/mL — AB (ref 211–911)

## 2014-01-24 LAB — HEPATIC FUNCTION PANEL
ALT: 11 U/L (ref 0–35)
AST: 23 U/L (ref 0–37)
Albumin: 4.5 g/dL (ref 3.5–5.2)
Alkaline Phosphatase: 39 U/L (ref 39–117)
BILIRUBIN TOTAL: 1 mg/dL (ref 0.2–1.2)
Bilirubin, Direct: 0 mg/dL (ref 0.0–0.3)
Total Protein: 7.1 g/dL (ref 6.0–8.3)

## 2014-01-24 LAB — SEDIMENTATION RATE: Sed Rate: 11 mm/hr (ref 0–22)

## 2014-01-24 LAB — TSH: TSH: 1.33 u[IU]/mL (ref 0.35–4.50)

## 2014-01-24 LAB — FOLATE: Folate: 8.9 ng/mL (ref >5.9)

## 2014-01-24 LAB — HEMOGLOBIN A1C: HEMOGLOBIN A1C: 5.5 % (ref 4.6–6.5)

## 2014-01-24 NOTE — Progress Notes (Signed)
Pre visit review using our clinic review tool, if applicable. No additional management support is needed unless otherwise documented below in the visit note.  Chief Complaint  Patient presents with  . Follow-up    HPI: Patient Joyce Jefferson  comes in today for SDA for  worsening problem evaluation. Tips of fingers went numbness  But nl temperature   And lasted hour   Arm  Nocturnal  Fingers new no motor changes   Uncertain if from thyroid or other problem that is worrisome  Unsettling to her  ? Is this an important  Sx. 2 years  Feet. Toes numb at times    Daughter recently dx with folac deficiency  ROS: See pertinent positives and negatives per HPI.  Referral to psych for testing add  Never got completed  She called and no answer  What to do next  eatind disorder controlled not a factor  Past Medical History  Diagnosis Date  . Palpitations     echo 2010  . Other abnormal glucose   . History of bulimia   . Depression   . Allergic rhinitis     Family History  Problem Relation Age of Onset  . Alcohol abuse    . Colon cancer Mother   . Diabetes    . Other Son     had vsd that closed ?  Marland Kitchen. Hypertension Father   . Heart attack Neg Hx   . Hyperlipidemia Neg Hx   . Sudden death Neg Hx   . ADD / ADHD Child     x3    History   Social History  . Marital Status: Married    Spouse Name: N/A    Number of Children: N/A  . Years of Education: N/A   Social History Main Topics  . Smoking status: Never Smoker   . Smokeless tobacco: Never Used  . Alcohol Use: Yes  . Drug Use: None  . Sexual Activity: None   Other Topics Concern  . 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 Fluoxetine HCl, PMDD, 10 MG TABS Take 1 tablet (10 mg total) by mouth daily. Wean as directed.  .  [DISCONTINUED] FLUoxetine (PROZAC) 20 MG capsule Take 1 capsule (20 mg total) by mouth daily.    EXAM:  BP 102/72 mmHg  Temp(Src) 98 F (36.7 C) (Oral)  Ht 5\' 9"  (1.753 m)  Wt 153 lb 8 oz (69.627 kg)  BMI 22.66 kg/m2  LMP 11/18/2010  Body mass index is 22.66 kg/(m^2).  GENERAL: vitals reviewed and listed above, alert, oriented, appears well hydrated and in no acute distress HEENT: atraumatic, conjunctiva  clear, no obvious abnormalities on inspection of external nose and ears OP : no lesion edema or exudate  NECK: no obvious masses on inspection palpation  LUNGS: clear to auscultation bilaterally, no wheezes, rales or rhonchi, good air movement CV: HRRR, no clubbing cyanosis or  peripheral edema nl cap refill  NEURO: oriented x 3 CN 3-12 appear intact. No focal muscle weakness or atrophy. DTRs symmetrical. Gait WNL.  Grossly non focal. No tremor or abnormal movement.  mptor nl  No color change vascular nl.  MS: moves all extremities without noticeable focal  abnormality PSYCH: pleasant and cooperative,  no obvious depression or anxiety Wt Readings from Last 3 Encounters:  01/24/14 153 lb 8 oz (69.627 kg)  09/22/13 150 lb (68.04 kg)  08/24/13 152 lb (68.947 kg)  Ate Smoothie this am 8 am  ASSESSMENT AND PLAN:  Discussed the following assessment and plan:  Numbness - atypical r/o metabolic ? if could be PN daughter has a low folate for non nutritional reasons - Plan: Basic metabolic panel, CBC with Differential, Hemoglobin A1c, TSH, T4, free, Hepatic function panel, Vitamin B12, Folate, Sedimentation rate, Ambulatory referral to Neurology  Attention and concentration deficit - re refer for eval dr Reggy EyeAltabet.  - Plan: Ambulatory referral to Psychology Lab to day  Referral   -Patient advised to return or notify health care team  if symptoms worsen ,persist or new concerns arise.  Patient Instructions  Will notify you  of labs when available. Plan neurology referral for  opinion.  Also will reinstitute referral to Dr Reggy EyeALtabet regarding add evaluation.    Neta MendsWanda K. Cordera Stineman M.D.

## 2014-01-24 NOTE — Patient Instructions (Signed)
Will notify you  of labs when available. Plan neurology referral for opinion.  Also will reinstitute referral to Dr Reggy EyeALtabet regarding add evaluation.

## 2014-01-25 NOTE — Addendum Note (Signed)
Addended byBerniece Andreas: Chayah Mckee K on: 01/25/2014 11:15 PM   Modules accepted: Orders

## 2014-02-11 ENCOUNTER — Telehealth: Payer: Self-pay | Admitting: Neurology

## 2014-02-11 NOTE — Telephone Encounter (Signed)
Pt moved her appt to 02-14-14 sue to us having a cancellation

## 2014-02-14 ENCOUNTER — Encounter: Payer: Self-pay | Admitting: Neurology

## 2014-02-14 ENCOUNTER — Other Ambulatory Visit (INDEPENDENT_AMBULATORY_CARE_PROVIDER_SITE_OTHER): Payer: BLUE CROSS/BLUE SHIELD

## 2014-02-14 ENCOUNTER — Ambulatory Visit (INDEPENDENT_AMBULATORY_CARE_PROVIDER_SITE_OTHER): Payer: BLUE CROSS/BLUE SHIELD | Admitting: Neurology

## 2014-02-14 VITALS — BP 110/70 | HR 57 | Ht 68.5 in | Wt 155.6 lb

## 2014-02-14 DIAGNOSIS — E538 Deficiency of other specified B group vitamins: Secondary | ICD-10-CM

## 2014-02-14 DIAGNOSIS — R413 Other amnesia: Secondary | ICD-10-CM

## 2014-02-14 DIAGNOSIS — R200 Anesthesia of skin: Secondary | ICD-10-CM

## 2014-02-14 LAB — VITAMIN B12: Vitamin B-12: 229 pg/mL (ref 211–911)

## 2014-02-14 NOTE — Progress Notes (Signed)
[facility] Clinic Note - Initial Visit   Date: 02/14/2013  Joyce Jefferson MRN: [id number] DOB: [birth year]   Dear Dr. Fabian SharpPanosh:   Thank you for your kind referral of Joyce Jefferson for consultation of toe and finger numbness. Although her history is well known to you, please allow us to reiterate it for the purpose of our medical record. The patient was accompanied to the clinic by self.    History of Present Illness: Joyce Jefferson is a 54 y.o. left-handed Caucasian female with anxiety presenting for evaluation of bilateral toe and finger numbness and memory changes.  She first starting experiencing numbness and cramping of her toes since early 2015, occuring every other day.  Symptoms are more apparent when wearing her Tom's shoes, which made her concerned of having a neuroma.  In the fall of 2015, she noticed reduced sensation of her finger tips bilaterally, lasting about an hour.  This has only occurred 3 times.   She also complains of memory problems for the past 10 years, especially with memory retrieval. When reading books, she does not always recall the characters.  She has difficulty with recalling peoples names.  She no problems managing her finances or driving.  She feels that she is not always paying attention to what is being said and symptoms may be related to poor attention.  No personality of behaviorial problems  Of note, her recent blood work revealed vitamin B12 deficiency.  She is not taking any supplements at this time, but is scheduled to see her PCP this week to discuss this.  Out-side paper records, electronic medical record, and images have been reviewed where available and summarized as:  Lab Results  Component Value Date   HGBA1C 5.5 01/24/2014   Lab Results  Component Value Date   TSH 1.33 01/24/2014   Lab Results  Component Value Date   VITAMINB12 149* 01/24/2014     Past Medical History  Diagnosis  Date  . Palpitations     echo 2010  . Other abnormal glucose   . History of bulimia   . Depression   . Allergic rhinitis     Past Surgical History  Procedure Laterality Date  . Hernia repair    . Repair of prolapsed rectum       Medications:  Current Outpatient Prescriptions on File Prior to Visit  Medication Sig Dispense Refill  . Fluoxetine HCl, PMDD, 10 MG TABS Take 1 tablet (10 mg total) by mouth daily. Wean as directed. 90 each 2   No current facility-administered medications on file prior to visit.    Allergies:  Allergies  Allergen Reactions  . Penicillins   . Sulfonamide Derivatives     Family History: Family History  Problem Relation Age of Onset  . Alcohol abuse    . Colon cancer Mother   . Diabetes    . Other Son     had vsd that closed ?  Marland Kitchen. Hypertension Father   . Heart attack Neg Hx   . Hyperlipidemia Neg Hx   . Sudden death Neg Hx   . ADD / ADHD Child     x3    Social History: History   Social History  . Marital Status: Married    Spouse Name: N/A    Number of Children: N/A  . Years of Education: N/A   Occupational History  . Not on file.   Social History Main Topics  . Smoking status: Never Smoker   .  Smokeless tobacco: Never Used  . Alcohol Use: Yes  . Drug Use: Not on file  . Sexual Activity: Not on file   Other Topics Concern  . Not on file   Social History Narrative   Lives in [place]   Married   Regular exercise- yes   Owns a wine import company   hh  Of 3    3 dogs pet turtles    Review of Systems:  CONSTITUTIONAL: No fevers, chills, night sweats, or weight loss.   EYES: No visual changes or eye pain ENT: No hearing changes.  No history of nose bleeds.   RESPIRATORY: No cough, wheezing and shortness of breath.   CARDIOVASCULAR: Negative for chest pain, and palpitations.   GI: Negative for abdominal discomfort, blood in stools or black stools.  No recent change in bowel habits.   GU:  No history of  incontinence.   MUSCLOSKELETAL: No history of joint pain or swelling.  No myalgias.   SKIN: Negative for lesions, rash, and itching.   HEMATOLOGY/ONCOLOGY: Negative for prolonged bleeding, bruising easily, and swollen nodes.  No history of cancer.   ENDOCRINE: Negative for cold or heat intolerance, polydipsia or goiter.   PSYCH:  No depression or anxiety symptoms.   NEURO: As Above.   Vital Signs:  BP 110/70 mmHg  Pulse 57  Ht 5' 8.5" (1.74 m)  Wt 155 lb 9 oz (70.563 kg)  BMI 23.31 kg/m2  SpO2 99%  LMP 11/18/2010 Montreal Cognitive Assessment  02/14/2014  Visuospatial/ Executive (0/5) 4  Naming (0/3) 3  Attention: Read list of digits (0/2) 2  Attention: Read list of letters (0/1) 1  Attention: Serial 7 subtraction starting at 100 (0/3) 3  Language: Repeat phrase (0/2) 2  Language : Fluency (0/1) 0  Abstraction (0/2) 2  Delayed Recall (0/5) 4  Orientation (0/6) 6  Total 27  Adjusted Score (based on education) 27    General Medical Exam:   General:  Well appearing, comfortable.   Eyes/ENT: see cranial nerve examination.   Neck: No masses appreciated.  Full range of motion without tenderness.  No carotid bruits. Respiratory:  Clear to auscultation, good air entry bilaterally.   Cardiac:  Regular rate and rhythm, no murmur.   Extremities:  No deformities, edema, or skin discoloration.  Skin:  No rashes or lesions.  Neurological Exam: MENTAL STATUS including orientation to time, place, person, recent and remote memory, attention span and concentration, language, and fund of knowledge is normal.  Speech is not dysarthric.  CRANIAL NERVES: II:  No visual field defects.  Unremarkable fundi.   III-IV-VI: Pupils equal round and reactive to light.  Normal conjugate, extra-ocular eye movements in all directions of gaze.  No nystagmus.  No ptosis.   V:  Normal facial sensation.  Jaw jerk is absent.   VII:  Normal facial symmetry and movements.  No pathologic facial reflexes.  VIII:   Normal hearing and vestibular function.   IX-X:  Normal palatal movement.   XI:  Normal shoulder shrug and head rotation.   XII:  Normal tongue strength and range of motion, no deviation or fasciculation.  MOTOR:  No atrophy, fasciculations or abnormal movements.  No pronator drift.  Tone is normal.    Right Upper Extremity:    Left Upper Extremity:    Deltoid  5/5   Deltoid  5/5   Biceps  5/5   Biceps  5/5   Triceps  5/5   Triceps  5/5  Wrist extensors  5/5   Wrist extensors  5/5   Wrist flexors  5/5   Wrist flexors  5/5   Finger extensors  5/5   Finger extensors  5/5   Finger flexors  5/5   Finger flexors  5/5   Dorsal interossei  5/5   Dorsal interossei  5/5   Abductor pollicis  5/5   Abductor pollicis  5/5   Tone (Ashworth scale)  0  Tone (Ashworth scale)  0   Right Lower Extremity:    Left Lower Extremity:    Hip flexors  5/5   Hip flexors  5/5   Hip extensors  5/5   Hip extensors  5/5   Knee flexors  5/5   Knee flexors  5/5   Knee extensors  5/5   Knee extensors  5/5   Dorsiflexors  5/5   Dorsiflexors  5/5   Plantarflexors  5/5   Plantarflexors  5/5   Toe extensors  5/5   Toe extensors  5/5   Toe flexors  5/5   Toe flexors  5/5   Tone (Ashworth scale)  0  Tone (Ashworth scale)  0   MSRs:  Right                                                                 Left brachioradialis 2+  brachioradialis 2+  biceps 2+  biceps 2+  triceps 2+  triceps 2+  patellar 2+  patellar 2+  ankle jerk 2+  ankle jerk 2+  Hoffman no  Hoffman no  plantar response down  plantar response down   SENSORY:  Normal and symmetric perception of light touch, pinprick, vibration, and proprioception.  Romberg's sign absent.   COORDINATION/GAIT: Normal finger-to- nose-finger and heel-to-shin.  Intact rapid alternating movements bilaterally.  Able to rise from a chair without using arms.  Gait narrow based and stable. Tandem and stressed gait intact.    IMPRESSION: Ms. Vitiello is a 54 year-old  female presenting for evaluation of intermittent paresthesias of the hands and feet and memory changes. Her exam shows very mild cognitive impairment based on MOCA testing (27/30) and remainder of her neurological examination is normal.  Her recent laboratory testing disclosed vitamin B12 deficiency (149) is most likely contributing to her paresthesias and cognitive changes.  My suspicion for peripheral neuropathy is low and I would hold on performing electrodiagnostic testing for now.  If symptoms do not improve with correction of vitamin B12, may consider additional testing going forward.  PLAN/RECOMMENDATIONS:  1.  Recommd treating underlying vitamin B12 deficiency with IM injections 2.  If not improvement, consider EMG and/or neuropsychology testing 3.  Return to clinic as needed    The duration of this appointment visit was 40 minutes of face-to-face time with the patient.  Greater than 50% of this time was spent in counseling, explanation of diagnosis, planning of further management, and coordination of care.   Thank you for allowing me to participate in patient's care.  If I can answer any additional questions, I would be pleased to do so.    Sincerely,    Brittlyn Cloe K. Allena Katz, DO

## 2014-02-14 NOTE — Patient Instructions (Signed)
1.  Recommd treating underlying vitamin B12 deficiency with IM injections 2.  If not improvement, consider EMG and/or neuropsychology testing 3.  Return to clinic as needed

## 2014-02-16 LAB — INTRINSIC FACTOR ANTIBODIES: INTRINSIC FACTOR: NEGATIVE

## 2014-02-17 LAB — METHYLMALONIC ACID, SERUM: Methylmalonic Acid, Quant: 192 nmol/L (ref 87–318)

## 2014-03-01 ENCOUNTER — Ambulatory Visit (INDEPENDENT_AMBULATORY_CARE_PROVIDER_SITE_OTHER): Payer: BLUE CROSS/BLUE SHIELD | Admitting: Internal Medicine

## 2014-03-01 ENCOUNTER — Encounter: Payer: Self-pay | Admitting: Internal Medicine

## 2014-03-01 VITALS — BP 100/70 | Temp 97.6°F | Ht 68.5 in | Wt 156.0 lb

## 2014-03-01 DIAGNOSIS — E538 Deficiency of other specified B group vitamins: Secondary | ICD-10-CM

## 2014-03-01 DIAGNOSIS — R200 Anesthesia of skin: Secondary | ICD-10-CM

## 2014-03-01 MED ORDER — CYANOCOBALAMIN 1000 MCG/ML IJ SOLN
1000.0000 ug | Freq: Once | INTRAMUSCULAR | Status: AC
  Administered 2014-03-01: 1000 ug via INTRAMUSCULAR

## 2014-03-01 NOTE — Patient Instructions (Signed)
BEGIN B12 INJECTION as discussed  1000 twice a week.  For 6-8 weeks  And then decide on further schedule Ok  To continue  Oral b12 as you are probably absorbing some also .  Plan b12 level in   8 weeks  ( before an injection)  Then OV    Vitamin B12 Deficiency Not having enough vitamin B12 is called a deficiency. Vitamin B12 is an important vitamin. Your body needs vitamin B12 to:   Make red blood cells.  Make DNA. This is the genetic material inside all of your cells.  Help your nerves work properly so they can carry messages from your brain to your body. CAUSES  Not eating enough foods that contain vitamin B12.  Not having enough stomach acid and digestive juices. The body needs these to absorb vitamin B12 from the food you eat.  Having certain digestive system diseases that make it hard to absorb vitamin B12. These diseases include Crohn's disease, chronic pancreatitis, and cystic fibrosis.  Having pernicious anemia, which is a condition where the body has too few red blood cells. People with this condition do not make enough of a protein called "intrinsic factor," which is needed to absorb vitamin B12.  Having a surgery in which part of the stomach or small intestine is removed.  Taking certain medicines that make it hard for the body to absorb vitamin B12. These medicines include:  Heartburn medicine (antacids and proton pump inhibitors).  A certain antibiotic medicine called neomycin, which fights infection.  Some medicines used to treat diabetes, tuberculosis, gout, and high cholesterol. RISK FACTORS Risk factors are things that make you more likely to develop a vitamin B12 deficiency. They include:  Being older than 50.  Being a vegetarian.  Being pregnant and a vegetarian or having a poor diet.  Taking certain drugs.  Being an alcoholic. SYMPTOMS You may have a vitamin B12 deficiency with no symptoms. However, a vitamin B12 deficiency can cause health problems  like anemia and nerve damage. These health problems can lead to many possible symptoms, including:  Weakness.  Fatigue.  Loss of appetite.  Weight loss.  Numbness or tingling in your hands and feet.  Redness and burning of the tongue.  Confusion or memory problems.  Depression.  Dizziness.  Sensory problems, such as loss of taste, color blindness, and ringing in the ears.  Diarrhea or constipation.  Trouble walking. DIAGNOSIS Various types of tests can be given to help find the cause of your vitamin B12 deficiency. These tests include:  A complete blood count (CBC). This test gives your caregiver an overall picture of what makes up your blood.  A blood test to measure your B12 level.  A blood test to measure intrinsic factor.  An endoscopy. This procedure uses a thin tube with a camera on the end to look into your stomach or intestines. TREATMENT Treatment for vitamin B12 deficiency depends on what is causing it. Common options include:  Changing your eating and drinking habits, such as:  Eating more foods that contain vitamin B12.  Not drinking as much alcohol or any alcohol.  Taking vitamin B12 supplements. Your caregiver will tell you what dose is best for you.  Getting vitamin B12 injections. Some people get these a few times a week. Others get them once a month. HOME CARE INSTRUCTIONS  Take all supplements as directed by your caregiver. Follow the directions carefully.  Get any injections your caregiver prescribes. Do not miss your appointments.  Eat lots of healthy foods that contain vitamin B12. Ask your caregiver if you should work with a nutritionist. Good things to include in your diet are:  Meat.  Poultry.  Fish.  Eggs.  Fortified cereal and dairy products. This means vitamin B12 has been added to the food. Check the label on the package to be sure.  Do not abuse alcohol.  Keep all follow-up appointments. Your caregiver will need to  perform blood tests to make sure your vitamin B12 deficiency is going away. SEEK MEDICAL CARE IF:  You have any questions about your treatment.  Your symptoms come back. MAKE SURE YOU:  Understand these instructions.  Will watch your condition.  Will get help right away if you are not doing well or get worse. Document Released: 04/08/2011 Document Reviewed: 04/08/2011 [facility] Patient Information 2015 [employer] [place]. This information is not intended to replace advice given to you by your health care provider. Make sure you discuss any questions you have with your health care provider.

## 2014-03-01 NOTE — Progress Notes (Signed)
Pre visit review using our clinic review tool, if applicable. No additional management support is needed unless otherwise documented below in the visit note.  Chief Complaint  Patient presents with  . Discuss Starting B12 Injections    HPI: [place] 54 y.o. comes in to discuss initiating  b12 injections  Advised by neurology based on borderline low readings  And hx of tingling   See note.   Began oral b12 supplement between both tests . No new sx   advised inj b12 and reassess if continuing  Asks about othervcauses of b12 deficiency .    ROS: See pertinent positives and negatives per HPI.  Past Medical History  Diagnosis Date  . Palpitations     echo 2010  . Other abnormal glucose   . History of bulimia   . Depression   . Allergic rhinitis     Family History  Problem Relation Age of Onset  . Alcohol abuse    . Colon cancer Mother   . Diabetes Paternal Grandmother   . Other Son     had vsd that closed ?  Marland Kitchen Hypertension Father   . Heart attack Neg Hx   . Hyperlipidemia Neg Hx   . Sudden death Neg Hx   . ADD / ADHD Child     x3  . Diabetes Mellitus II Paternal Uncle     History   Social History  . Marital Status: Married    Spouse Name: N/A    Number of Children: N/A  . Years of Education: N/A   Social History Main Topics  . Smoking status: Never Smoker   . Smokeless tobacco: Never Used  . Alcohol Use: 0.0 oz/week    0 Not specified per week     Comment: 2 glasses wine nightly  . Drug Use: No  . Sexual Activity: None   Other Topics Concern  . 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    3 dogs pet turtles    Outpatient Encounter Prescriptions as of 03/01/2014  Medication Sig  . Fluoxetine HCl, PMDD, 10 MG TABS Take 1 tablet (10 mg total) by mouth daily. Wean as directed.  . [EXPIRED]  cyanocobalamin ((VITAMIN B-12)) injection 1,000 mcg     EXAM:  BP 100/70 mmHg  Temp(Src) 97.6 F (36.4 C) (Oral)  Ht 5' 8.5" (1.74 m)  Wt 156 lb (70.761 kg)  BMI 23.37 kg/m2  LMP 11/18/2010  Body mass index is 23.37 kg/(m^2).  GENERAL: vitals reviewed and listed above, alert, oriented, appears well hydrated and in no acute distress PSYCH: pleasant and cooperative, no obvious depression or anxiety 149 then 229 nl mma and neg IF A   ASSESSMENT AND PLAN:  Discussed the following assessment and plan:  B12 deficiency - neg ifa ele mma   Low vitamin B12 level - Plan: cyanocobalamin ((VITAMIN B-12)) injection 1,000 mcg  Numbness Presumed related    Disc inj and oral at this time 2 x per week and then   Decide on plan can also  take oral .  Uncertain cause at this time   Will follow .  -Patient advised to return or notify health care team  if symptoms worsen ,persist or new concerns arise.  Patient Instructions  BEGIN B12 INJECTION as discussed  1000  twice a week.  For 6-8 weeks  And then decide on further schedule Ok  To continue  Oral b12 as you are probably absorbing some also .  Plan b12 level in   8 weeks  ( before an injection)  Then OV    Vitamin B12 Deficiency Not having enough vitamin B12 is called a deficiency. Vitamin B12 is an important vitamin. Your body needs vitamin B12 to:   Make red blood cells.  Make DNA. This is the genetic material inside all of your cells.  Help your nerves work properly so they can carry messages from your brain to your body. CAUSES  Not eating enough foods that contain vitamin B12.  Not having enough stomach acid and digestive juices. The body needs these to absorb vitamin B12 from the food you eat.  Having certain digestive system diseases that make it hard to absorb vitamin B12. These diseases include Crohn's disease, chronic pancreatitis, and cystic fibrosis.  Having pernicious anemia, which is a condition where the body has  too few red blood cells. People with this condition do not make enough of a protein called "intrinsic factor," which is needed to absorb vitamin B12.  Having a surgery in which part of the stomach or small intestine is removed.  Taking certain medicines that make it hard for the body to absorb vitamin B12. These medicines include:  Heartburn medicine (antacids and proton pump inhibitors).  A certain antibiotic medicine called neomycin, which fights infection.  Some medicines used to treat diabetes, tuberculosis, gout, and high cholesterol. RISK FACTORS Risk factors are things that make you more likely to develop a vitamin B12 deficiency. They include:  Being older than 50.  Being a vegetarian.  Being pregnant and a vegetarian or having a poor diet.  Taking certain drugs.  Being an alcoholic. SYMPTOMS You may have a vitamin B12 deficiency with no symptoms. However, a vitamin B12 deficiency can cause health problems like anemia and nerve damage. These health problems can lead to many possible symptoms, including:  Weakness.  Fatigue.  Loss of appetite.  Weight loss.  Numbness or tingling in your hands and feet.  Redness and burning of the tongue.  Confusion or memory problems.  Depression.  Dizziness.  Sensory problems, such as loss of taste, color blindness, and ringing in the ears.  Diarrhea or constipation.  Trouble walking. DIAGNOSIS Various types of tests can be given to help find the cause of your vitamin B12 deficiency. These tests include:  A complete blood count (CBC). This test gives your caregiver an overall picture of what makes up your blood.  A blood test to measure your B12 level.  A blood test to measure intrinsic factor.  An endoscopy. This procedure uses a thin tube with a camera on the end to look into your stomach or intestines. TREATMENT Treatment for vitamin B12 deficiency depends on what is causing it. Common options  include:  Changing your eating and drinking habits, such as:  Eating more foods that contain vitamin B12.  Not drinking as much alcohol or any alcohol.  Taking vitamin B12 supplements. Your caregiver will tell you what dose is best for you.  Getting vitamin B12 injections. Some people get these a few times a week. Others get them once a month. HOME CARE INSTRUCTIONS  Take all supplements as directed by your caregiver. Follow the directions carefully.  Get any injections your caregiver prescribes. Do not miss your appointments.  Eat lots of healthy foods that  contain vitamin B12. Ask your caregiver if you should work with a nutritionist. Good things to include in your diet are:  Meat.  Poultry.  Fish.  Eggs.  Fortified cereal and dairy products. This means vitamin B12 has been added to the food. Check the label on the package to be sure.  Do not abuse alcohol.  Keep all follow-up appointments. Your caregiver will need to perform blood tests to make sure your vitamin B12 deficiency is going away. SEEK MEDICAL CARE IF:  You have any questions about your treatment.  Your symptoms come back. MAKE SURE YOU:  Understand these instructions.  Will watch your condition.  Will get help right away if you are not doing well or get worse. Document Released: 04/08/2011 Document Reviewed: 04/08/2011 [facility] Patient Information 2015 [employer] [place] [place]. This information is not intended to replace advice given to you by your health care provider. Make sure you discuss any questions you have with your health care provider.      Neta MendsWanda K. Ravinder Lukehart M.D.  Neuro assessment IMPRESSION: Joyce [place] is a 54 year-old female presenting for evaluation of intermittent paresthesias of the hands and feet and memory changes. Her exam shows very mild cognitive impairment based on MOCA testing (27/30) and remainder of her neurological examination is normal. Her recent laboratory testing disclosed  vitamin B12 deficiency (149) is most likely contributing to her paresthesias and cognitive changes. My suspicion for peripheral neuropathy is low and I would hold on performing electrodiagnostic testing for now. If symptoms do not improve with correction of vitamin B12, may consider additional testing going forward.  PLAN/RECOMMENDATIONS:  1. Recommd treating underlying vitamin B12 deficiency with IM injections 2. If not improvement, consider EMG and/or neuropsychology testing 3. Return to clinic as needed

## 2014-03-04 ENCOUNTER — Ambulatory Visit (INDEPENDENT_AMBULATORY_CARE_PROVIDER_SITE_OTHER): Payer: BLUE CROSS/BLUE SHIELD | Admitting: Family Medicine

## 2014-03-04 DIAGNOSIS — E538 Deficiency of other specified B group vitamins: Secondary | ICD-10-CM

## 2014-03-04 MED ORDER — CYANOCOBALAMIN 1000 MCG/ML IJ SOLN
1000.0000 ug | Freq: Once | INTRAMUSCULAR | Status: AC
  Administered 2014-03-04: 1000 ug via INTRAMUSCULAR

## 2014-03-07 ENCOUNTER — Ambulatory Visit (INDEPENDENT_AMBULATORY_CARE_PROVIDER_SITE_OTHER): Payer: BLUE CROSS/BLUE SHIELD | Admitting: Family Medicine

## 2014-03-07 DIAGNOSIS — E538 Deficiency of other specified B group vitamins: Secondary | ICD-10-CM

## 2014-03-07 MED ORDER — CYANOCOBALAMIN 1000 MCG/ML IJ SOLN: 1000.0000 ug | Freq: Once | INTRAMUSCULAR | Status: DC

## 2014-03-07 MED ORDER — CYANOCOBALAMIN 1000 MCG/ML IJ SOLN
1000.0000 ug | Freq: Once | INTRAMUSCULAR | Status: AC
  Administered 2014-03-07: 1000 ug via INTRAMUSCULAR

## 2014-03-08 ENCOUNTER — Ambulatory Visit: Payer: BC Managed Care – PPO | Admitting: Neurology

## 2014-03-14 ENCOUNTER — Ambulatory Visit: Payer: BLUE CROSS/BLUE SHIELD | Admitting: Family Medicine

## 2014-04-08 ENCOUNTER — Ambulatory Visit (INDEPENDENT_AMBULATORY_CARE_PROVIDER_SITE_OTHER): Payer: BLUE CROSS/BLUE SHIELD | Admitting: Family Medicine

## 2014-04-08 DIAGNOSIS — E538 Deficiency of other specified B group vitamins: Secondary | ICD-10-CM

## 2014-04-08 MED ORDER — CYANOCOBALAMIN 1000 MCG/ML IJ SOLN
1000.0000 ug | Freq: Once | INTRAMUSCULAR | Status: AC
  Administered 2014-04-08: 1000 ug via INTRAMUSCULAR

## 2014-04-12 ENCOUNTER — Ambulatory Visit (INDEPENDENT_AMBULATORY_CARE_PROVIDER_SITE_OTHER): Payer: BLUE CROSS/BLUE SHIELD | Admitting: Family Medicine

## 2014-04-12 DIAGNOSIS — E538 Deficiency of other specified B group vitamins: Secondary | ICD-10-CM

## 2014-04-12 MED ORDER — CYANOCOBALAMIN 1000 MCG/ML IJ SOLN
1000.0000 ug | Freq: Once | INTRAMUSCULAR | Status: AC
  Administered 2014-04-12: 1000 ug via INTRAMUSCULAR

## 2014-04-15 ENCOUNTER — Ambulatory Visit (INDEPENDENT_AMBULATORY_CARE_PROVIDER_SITE_OTHER): Payer: BLUE CROSS/BLUE SHIELD | Admitting: Family Medicine

## 2014-04-15 DIAGNOSIS — E538 Deficiency of other specified B group vitamins: Secondary | ICD-10-CM

## 2014-04-18 MED ORDER — CYANOCOBALAMIN 1000 MCG/ML IJ SOLN
1000.0000 ug | Freq: Once | INTRAMUSCULAR | Status: AC
  Administered 2014-04-15: 1000 ug via INTRAMUSCULAR

## 2014-05-23 ENCOUNTER — Ambulatory Visit (INDEPENDENT_AMBULATORY_CARE_PROVIDER_SITE_OTHER): Payer: BLUE CROSS/BLUE SHIELD | Admitting: Family Medicine

## 2014-05-23 DIAGNOSIS — E538 Deficiency of other specified B group vitamins: Secondary | ICD-10-CM | POA: Diagnosis not present

## 2014-05-23 MED ORDER — CYANOCOBALAMIN 1000 MCG/ML IJ SOLN
1000.0000 ug | Freq: Once | INTRAMUSCULAR | Status: AC
  Administered 2014-05-23: 1000 ug via INTRAMUSCULAR

## 2014-05-27 ENCOUNTER — Other Ambulatory Visit: Payer: Self-pay | Admitting: Family Medicine

## 2014-05-27 ENCOUNTER — Ambulatory Visit (INDEPENDENT_AMBULATORY_CARE_PROVIDER_SITE_OTHER): Payer: BLUE CROSS/BLUE SHIELD | Admitting: Family Medicine

## 2014-05-27 DIAGNOSIS — E538 Deficiency of other specified B group vitamins: Secondary | ICD-10-CM

## 2014-05-27 MED ORDER — CYANOCOBALAMIN 1000 MCG/ML IJ SOLN
1000.0000 ug | Freq: Once | INTRAMUSCULAR | Status: AC
  Administered 2014-05-27: 1000 ug via INTRAMUSCULAR

## 2014-05-30 ENCOUNTER — Other Ambulatory Visit (INDEPENDENT_AMBULATORY_CARE_PROVIDER_SITE_OTHER): Payer: BLUE CROSS/BLUE SHIELD

## 2014-05-30 DIAGNOSIS — E538 Deficiency of other specified B group vitamins: Secondary | ICD-10-CM

## 2014-05-30 LAB — VITAMIN B12: Vitamin B-12: 633 pg/mL (ref 211–911)

## 2014-06-01 ENCOUNTER — Ambulatory Visit: Payer: BLUE CROSS/BLUE SHIELD | Admitting: Internal Medicine

## 2014-06-01 ENCOUNTER — Ambulatory Visit (INDEPENDENT_AMBULATORY_CARE_PROVIDER_SITE_OTHER): Payer: BLUE CROSS/BLUE SHIELD | Admitting: Internal Medicine

## 2014-06-01 ENCOUNTER — Encounter: Payer: Self-pay | Admitting: Internal Medicine

## 2014-06-01 VITALS — BP 102/60 | Temp 98.1°F | Ht 69.0 in | Wt 154.2 lb

## 2014-06-01 DIAGNOSIS — R200 Anesthesia of skin: Secondary | ICD-10-CM | POA: Diagnosis not present

## 2014-06-01 DIAGNOSIS — E538 Deficiency of other specified B group vitamins: Secondary | ICD-10-CM

## 2014-06-01 DIAGNOSIS — Z79899 Other long term (current) drug therapy: Secondary | ICD-10-CM | POA: Diagnosis not present

## 2014-06-01 DIAGNOSIS — R7989 Other specified abnormal findings of blood chemistry: Secondary | ICD-10-CM

## 2014-06-01 NOTE — Patient Instructions (Signed)
b12 injections every 2 weeks   For the next 12 weeks. Can add folic  acid 1 mg  .     Consider    increasing change  prozac .  Or other med that daughter does well.   Rov in 3 months  Look into the counselor status .

## 2014-06-01 NOTE — Progress Notes (Signed)
Pre visit review using our clinic review tool, if applicable. No additional management support is needed unless otherwise documented below in the visit note.  Chief Complaint  Patient presents with  . Follow-up    b12 fatigue depression    HPI: Joyce [place] 54 y.o. comse in for fu of b12 deficincy  After 8 week course of b12  And level  Numbness is better may have s neuroma tingling is fingers less Tingling less  ? Depressed  Other problems  And anxiety .   ? About depression and  Genetic issue.  Daughter is battling depr anxiety [place]  Has poss folate pathway defect.  ROS: See pertinent positives and negatives per HPI.  Past Medical History  Diagnosis Date  . Palpitations     echo 2010  . Other abnormal glucose   . History of bulimia   . Depression   . Allergic rhinitis     Family History  Problem Relation Age of Onset  . Alcohol abuse    . Colon cancer Mother   . Diabetes Paternal Grandmother   . Other Son     had vsd that closed ?  Marland Kitchen. Hypertension Father   . Heart attack Neg Hx   . Hyperlipidemia Neg Hx   . Sudden death Neg Hx   . ADD / ADHD Child     x3  . Diabetes Mellitus II Paternal Uncle     History   Social History  . Marital Status: Married    Spouse Name: N/A  . Number of Children: N/A  . Years of Education: N/A   Social History Main Topics  . Smoking status: Never Smoker   . Smokeless tobacco: Never Used  . Alcohol Use: 0.0 oz/week    0 Standard drinks or equivalent per week     Comment: 2 glasses wine nightly  . Drug Use: No  . Sexual Activity: Not on file   Other Topics Concern  . 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    3 dogs pet turtles    Outpatient Prescriptions Prior to Visit  Medication Sig Dispense Refill  . Fluoxetine HCl, PMDD, 10 MG TABS Take 1 tablet (10 mg  total) by mouth daily. Wean as directed. 90 each 2   No facility-administered medications prior to visit.     EXAM:  BP 102/60 mmHg  Temp(Src) 98.1 F (36.7 C) (Oral)  Ht 5\' 9"  (1.753 m)  Wt 154 lb 3.2 oz (69.945 kg)  BMI 22.76 kg/m2  LMP 11/18/2010  Body mass index is 22.76 kg/(m^2).  GENERAL: vitals reviewed and listed above, alert, oriented, appears well hydrated and in no acute distress MS: moves all extremities without noticeable focal  abnormality PSYCH: pleasant and cooperative, no obvious depression or anxiety mildly lat nl speech and attntion Lab Results  Component Value Date   VITAMINB12 633 05/30/2014    ASSESSMENT AND PLAN:  Discussed the following assessment and plan:  B12 deficiency - improved level at goal   Low vitamin B12 level  Numbness  Medication management Information on  counseling  Some improvement but struggling with depression ( in family) consideration of change med or increased dose  Total visit 25mins > 50% spent counseling and coordinating care as indicated in above note and in instructions  to patient . Regarding dx no gi hx uncertain cause  If  persistent or progressive get back with neuro and also attend to mood issues  -Patient advised to return or notify health care team  if symptoms worsen ,persist or new concerns arise.  Patient Instructions  b12 injections every 2 weeks   For the next 12 weeks. Can add folic  acid 1 mg  .     Consider    increasing change  prozac .  Or other med that daughter does well.   Rov in 3 months  Look into the counselor status .   W.K. Panosh M.D.

## 2014-06-10 ENCOUNTER — Other Ambulatory Visit (INDEPENDENT_AMBULATORY_CARE_PROVIDER_SITE_OTHER): Payer: BLUE CROSS/BLUE SHIELD | Admitting: Family Medicine

## 2014-06-10 DIAGNOSIS — E538 Deficiency of other specified B group vitamins: Secondary | ICD-10-CM

## 2014-06-10 MED ORDER — CYANOCOBALAMIN 1000 MCG/ML IJ SOLN
1000.0000 ug | Freq: Once | INTRAMUSCULAR | Status: AC
  Administered 2014-06-10: 1000 ug via INTRAMUSCULAR

## 2014-07-01 ENCOUNTER — Ambulatory Visit (INDEPENDENT_AMBULATORY_CARE_PROVIDER_SITE_OTHER): Payer: BLUE CROSS/BLUE SHIELD | Admitting: Family Medicine

## 2014-07-01 ENCOUNTER — Ambulatory Visit: Payer: BLUE CROSS/BLUE SHIELD | Admitting: Family Medicine

## 2014-07-01 DIAGNOSIS — E538 Deficiency of other specified B group vitamins: Secondary | ICD-10-CM

## 2014-07-01 MED ORDER — CYANOCOBALAMIN 1000 MCG/ML IJ SOLN
1000.0000 ug | Freq: Once | INTRAMUSCULAR | Status: AC
  Administered 2014-07-01: 1000 ug via INTRAMUSCULAR

## 2014-07-15 ENCOUNTER — Ambulatory Visit (INDEPENDENT_AMBULATORY_CARE_PROVIDER_SITE_OTHER): Payer: BLUE CROSS/BLUE SHIELD | Admitting: Family Medicine

## 2014-07-15 DIAGNOSIS — E538 Deficiency of other specified B group vitamins: Secondary | ICD-10-CM

## 2014-07-15 MED ORDER — CYANOCOBALAMIN 1000 MCG/ML IJ SOLN
1000.0000 ug | Freq: Once | INTRAMUSCULAR | Status: AC
  Administered 2014-07-15: 1000 ug via INTRAMUSCULAR

## 2014-08-24 ENCOUNTER — Ambulatory Visit: Payer: BLUE CROSS/BLUE SHIELD | Admitting: Family Medicine

## 2014-08-31 ENCOUNTER — Ambulatory Visit (INDEPENDENT_AMBULATORY_CARE_PROVIDER_SITE_OTHER): Payer: BLUE CROSS/BLUE SHIELD | Admitting: Family Medicine

## 2014-08-31 DIAGNOSIS — E538 Deficiency of other specified B group vitamins: Secondary | ICD-10-CM | POA: Diagnosis not present

## 2014-08-31 MED ORDER — CYANOCOBALAMIN 1000 MCG/ML IJ SOLN
1000.0000 ug | Freq: Once | INTRAMUSCULAR | Status: AC
  Administered 2014-08-31: 1000 ug via INTRAMUSCULAR

## 2014-09-14 ENCOUNTER — Ambulatory Visit (INDEPENDENT_AMBULATORY_CARE_PROVIDER_SITE_OTHER): Payer: BLUE CROSS/BLUE SHIELD | Admitting: Family Medicine

## 2014-09-14 DIAGNOSIS — E538 Deficiency of other specified B group vitamins: Secondary | ICD-10-CM

## 2014-09-14 MED ORDER — CYANOCOBALAMIN 1000 MCG/ML IJ SOLN
1000.0000 ug | Freq: Once | INTRAMUSCULAR | Status: AC
  Administered 2014-09-14: 1000 ug via INTRAMUSCULAR

## 2014-09-28 ENCOUNTER — Ambulatory Visit: Payer: BLUE CROSS/BLUE SHIELD | Admitting: Family Medicine

## 2014-10-17 ENCOUNTER — Ambulatory Visit: Payer: BLUE CROSS/BLUE SHIELD | Admitting: Family Medicine

## 2014-10-31 ENCOUNTER — Ambulatory Visit (INDEPENDENT_AMBULATORY_CARE_PROVIDER_SITE_OTHER): Payer: BLUE CROSS/BLUE SHIELD | Admitting: Family Medicine

## 2014-10-31 DIAGNOSIS — Z23 Encounter for immunization: Secondary | ICD-10-CM | POA: Diagnosis not present

## 2014-10-31 DIAGNOSIS — E538 Deficiency of other specified B group vitamins: Secondary | ICD-10-CM

## 2014-10-31 MED ORDER — CYANOCOBALAMIN 1000 MCG/ML IJ SOLN
1000.0000 ug | Freq: Once | INTRAMUSCULAR | Status: AC
  Administered 2014-10-31: 1000 ug via INTRAMUSCULAR

## 2014-11-07 ENCOUNTER — Telehealth: Payer: Self-pay | Admitting: Internal Medicine

## 2014-11-07 NOTE — Telephone Encounter (Signed)
[id number] [place], [place] - [address] [phone]  Requesting refill of Fluoxetine (Prozac)  capsule #30

## 2014-11-08 NOTE — Telephone Encounter (Signed)
Ok to refill x 6 months  Make sure we arrange  Lab an  rov about her b12 injections

## 2014-11-08 NOTE — Telephone Encounter (Signed)
we were trying to wean but she may have wanted to increase back to 20 mg  As she was on 20 mg in past   Please confirm  w  Patient  Which dose she wishes refilled

## 2014-11-08 NOTE — Telephone Encounter (Signed)
Reviewed chart.  Medication list says 10 mg.  Request is for 20 mg.  Has there been an increase?

## 2014-11-09 NOTE — Telephone Encounter (Signed)
Spoke to the pt.  She is taking both 10 mg and 20 mg.  Depends on the day.  How do you wish to fill?

## 2014-11-09 NOTE — Telephone Encounter (Signed)
need to pick one dose and  Stick to it for a month .  Advise  20 mg per day refill x 3 months  Instead of 6 and then see how she is doing.

## 2014-11-10 NOTE — Telephone Encounter (Signed)
Left a message for a return call.

## 2014-11-11 NOTE — Telephone Encounter (Signed)
Left a message for a return call.

## 2014-11-14 ENCOUNTER — Ambulatory Visit: Payer: BLUE CROSS/BLUE SHIELD | Admitting: Family Medicine

## 2014-11-14 NOTE — Telephone Encounter (Signed)
Left a message for a return call.

## 2014-11-15 ENCOUNTER — Ambulatory Visit (INDEPENDENT_AMBULATORY_CARE_PROVIDER_SITE_OTHER): Payer: BLUE CROSS/BLUE SHIELD | Admitting: Family Medicine

## 2014-11-15 ENCOUNTER — Ambulatory Visit: Payer: BLUE CROSS/BLUE SHIELD | Admitting: Family Medicine

## 2014-11-15 DIAGNOSIS — E538 Deficiency of other specified B group vitamins: Secondary | ICD-10-CM

## 2014-11-15 MED ORDER — CYANOCOBALAMIN 1000 MCG/ML IJ SOLN
1000.0000 ug | Freq: Once | INTRAMUSCULAR | Status: AC
  Administered 2014-11-15: 1000 ug via INTRAMUSCULAR

## 2014-11-15 NOTE — Telephone Encounter (Signed)
Seen pt today to give b12 injection.  She will take 20 mg for the next three months and will then re evaluate.  She does not need a refill of her medication at this time.  Asked that she contact the pharmacy when a refill is needed.

## 2014-11-29 ENCOUNTER — Ambulatory Visit (INDEPENDENT_AMBULATORY_CARE_PROVIDER_SITE_OTHER): Payer: BLUE CROSS/BLUE SHIELD | Admitting: Family Medicine

## 2014-11-29 ENCOUNTER — Telehealth: Payer: Self-pay | Admitting: Family Medicine

## 2014-11-29 DIAGNOSIS — E538 Deficiency of other specified B group vitamins: Secondary | ICD-10-CM

## 2014-11-29 MED ORDER — CYANOCOBALAMIN 1000 MCG/ML IJ SOLN
1000.0000 ug | Freq: Once | INTRAMUSCULAR | Status: AC
  Administered 2014-11-29: 1000 ug via INTRAMUSCULAR

## 2014-11-29 NOTE — Telephone Encounter (Signed)
Pt would like to know when she needs to come in for lab work and follow up ov.  Please advise.  Thanks!

## 2014-11-29 NOTE — Telephone Encounter (Signed)
Due august September  So    Now  Please  Obtain cbcdiff , cmp and B12 levels  ( not after injectino)  Dx b 12 deficicny  Numbness  Then OV with results

## 2014-12-01 ENCOUNTER — Other Ambulatory Visit: Payer: Self-pay | Admitting: Family Medicine

## 2014-12-01 DIAGNOSIS — E538 Deficiency of other specified B group vitamins: Secondary | ICD-10-CM

## 2014-12-01 DIAGNOSIS — R200 Anesthesia of skin: Secondary | ICD-10-CM

## 2014-12-01 NOTE — Telephone Encounter (Signed)
Please help the pt to make lab and follow up appointment.  I have placed the lab orders.  Thanks!

## 2014-12-01 NOTE — Telephone Encounter (Signed)
Pt has sch blood work for 12-02-14. Pt is walking her dog and will schedule follow up tomorrow .

## 2014-12-02 ENCOUNTER — Other Ambulatory Visit (INDEPENDENT_AMBULATORY_CARE_PROVIDER_SITE_OTHER): Payer: BLUE CROSS/BLUE SHIELD

## 2014-12-02 DIAGNOSIS — E538 Deficiency of other specified B group vitamins: Secondary | ICD-10-CM | POA: Diagnosis not present

## 2014-12-02 DIAGNOSIS — R200 Anesthesia of skin: Secondary | ICD-10-CM | POA: Diagnosis not present

## 2014-12-02 LAB — VITAMIN B12: VITAMIN B 12: 821 pg/mL (ref 211–911)

## 2014-12-02 LAB — COMPREHENSIVE METABOLIC PANEL
ALBUMIN: 4.2 g/dL (ref 3.5–5.2)
ALK PHOS: 38 U/L — AB (ref 39–117)
ALT: 11 U/L (ref 0–35)
AST: 16 U/L (ref 0–37)
BUN: 11 mg/dL (ref 6–23)
CO2: 33 mEq/L — ABNORMAL HIGH (ref 19–32)
CREATININE: 0.86 mg/dL (ref 0.40–1.20)
Calcium: 9.6 mg/dL (ref 8.4–10.5)
Chloride: 103 mEq/L (ref 96–112)
GFR: 73.19 mL/min (ref >60.00)
Glucose, Bld: 98 mg/dL (ref 70–99)
Potassium: 4.6 mEq/L (ref 3.5–5.1)
SODIUM: 141 meq/L (ref 135–145)
TOTAL PROTEIN: 6.4 g/dL (ref 6.0–8.3)
Total Bilirubin: 1 mg/dL (ref 0.2–1.2)

## 2014-12-02 LAB — CBC WITH DIFFERENTIAL/PLATELET
BASOS PCT: 0.5 % (ref 0.0–3.0)
Basophils Absolute: 0 10*3/uL (ref 0.0–0.1)
EOS ABS: 0.1 10*3/uL (ref 0.0–0.7)
EOS PCT: 3 % (ref 0.0–5.0)
HCT: 39.8 % (ref 36.0–46.0)
HEMOGLOBIN: 13.5 g/dL (ref 12.0–15.0)
LYMPHS PCT: 35.6 % (ref 12.0–46.0)
Lymphs Abs: 1.4 10*3/uL (ref 0.7–4.0)
MCHC: 33.9 g/dL (ref 30.0–36.0)
MCV: 88.5 fl (ref 78.0–100.0)
MONOS PCT: 7.7 % (ref 3.0–12.0)
Monocytes Absolute: 0.3 10*3/uL (ref 0.1–1.0)
NEUTROS ABS: 2.2 10*3/uL (ref 1.4–7.7)
Neutrophils Relative %: 53.2 % (ref 43.0–77.0)
Platelets: 204 10*3/uL (ref 150.0–400.0)
RBC: 4.5 Mil/uL (ref 3.87–5.11)
RDW: 13.2 % (ref 11.5–15.5)
WBC: 4.1 10*3/uL (ref 4.0–10.5)

## 2014-12-06 ENCOUNTER — Telehealth: Payer: Self-pay | Admitting: Internal Medicine

## 2014-12-06 NOTE — Telephone Encounter (Signed)
Pt thinks she may have shingles starting on her face. Tingling, bruised feeling, and pt is going out of town on Sunday. Also,  would like to follow up on labs done with dr Fabian Sharppanosh as well. Is it ok to work in at the end of the day?

## 2014-12-06 NOTE — Telephone Encounter (Signed)
Please document that you spoke to the pt and close the note.

## 2014-12-06 NOTE — Telephone Encounter (Signed)
Called pt back to advise dr Fabian Sharppanosh did not have any appointments available  Tomorrow.   Pt stated she prefers to see Dr Fabian SharpPanosh and will wait for a thurs appt. A 30 min appt was made for Thursday, 11/07/14 at 9:15 am. [facility]

## 2014-12-08 ENCOUNTER — Ambulatory Visit (INDEPENDENT_AMBULATORY_CARE_PROVIDER_SITE_OTHER): Payer: BLUE CROSS/BLUE SHIELD | Admitting: Internal Medicine

## 2014-12-08 ENCOUNTER — Encounter: Payer: Self-pay | Admitting: Internal Medicine

## 2014-12-08 VITALS — BP 102/64 | Temp 98.2°F | Wt 156.9 lb

## 2014-12-08 DIAGNOSIS — R41840 Attention and concentration deficit: Secondary | ICD-10-CM

## 2014-12-08 DIAGNOSIS — Z79899 Other long term (current) drug therapy: Secondary | ICD-10-CM

## 2014-12-08 DIAGNOSIS — E538 Deficiency of other specified B group vitamins: Secondary | ICD-10-CM | POA: Diagnosis not present

## 2014-12-08 DIAGNOSIS — R21 Rash and other nonspecific skin eruption: Secondary | ICD-10-CM | POA: Diagnosis not present

## 2014-12-08 MED ORDER — FLUOXETINE HCL (PMDD) 10 MG PO TABS: 1.0000 | ORAL_TABLET | Freq: Every day | ORAL | Status: AC

## 2014-12-08 NOTE — Patient Instructions (Signed)
b12 level is better  Change to every 6 weeks b12 injection. Get on schedule for this .  dont see shingles but get with us if  Nerve pain and early rash .   ROV   In a year.

## 2014-12-08 NOTE — Progress Notes (Signed)
Pre visit review using our clinic review tool, if applicable. No additional management support is needed unless otherwise documented below in the visit note.  Chief Complaint  Patient presents with  . Discuss Reoccuring Rash  . Discuss Lab Work Results    HPI: Joyce [place] 54 y.o. comes in fro fu of b12 defic with tingling  Leg are better  Med management  b12  Fu  Depression:    Up and down .   Related to kids  Moods.   Taking mostly 20 mg  prozac   Sept  Itch    Not as bad as PI   2 days ago had sore area right cheek  ? If shingles . Getting better   Has adhd asks about seeing focus group. Had se of concerta and adderall? When younger  Child dose well on vyvanse   ROS: See pertinent positives and negatives per HPI.  Past Medical History  Diagnosis Date  . Palpitations     echo 2010  . Other abnormal glucose   . History of bulimia   . Depression   . Allergic rhinitis     Family History  Problem Relation Age of Onset  . Alcohol abuse    . Colon cancer Mother   . Diabetes Paternal Grandmother   . Other Son     had vsd that closed ?  Marland Kitchen. Hypertension Father   . Heart attack Neg Hx   . Hyperlipidemia Neg Hx   . Sudden death Neg Hx   . ADD / ADHD Child     x3  . Diabetes Mellitus II Paternal Uncle     Social History   Social History  . Marital Status: Married    Spouse Name: N/A  . Number of Children: N/A  . Years of Education: N/A   Social History Main Topics  . Smoking status: Never Smoker   . Smokeless tobacco: Never Used  . Alcohol Use: 0.0 oz/week    0 Standard drinks or equivalent per week     Comment: 2 glasses wine nightly  . Drug Use: No  . Sexual Activity: Not Asked   Other Topics Concern  . 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    3 dogs pet turtles    Outpatient  Prescriptions Prior to Visit  Medication Sig Dispense Refill  . Fluoxetine HCl, PMDD, 10 MG TABS Take 1 tablet (10 mg total) by mouth daily. Wean as directed. 90 each 2   No facility-administered medications prior to visit.     EXAM:  BP 102/64 mmHg  Temp(Src) 98.2 F (36.8 C) (Oral)  Wt 156 lb 14.4 oz (71.169 kg)  LMP 11/18/2010  Body mass index is 23.16 kg/(m^2).  GENERAL: vitals reviewed and listed above, alert, oriented, appears well hydrated and in no acute distress HEENT: atraumatic, conjunctiva  clear, no obvious abnormalities on inspection of external nose and ears OP : no lesion edema or exudate  NECK: no obvious masses on inspection palpation  LUNGS: clear to auscultation bilaterally, no wheezes, rales or rhonchi, good air movement CV: HRRR, no clubbing cyanosis or  peripheral edema nl cap refill  MS: moves all extremities without noticeable focal  abnormality PSYCH: pleasant and cooperative, no obvious depression or anxiety Lab Results  Component Value Date  WBC 4.1 12/02/2014   HGB 13.5 12/02/2014   HCT 39.8 12/02/2014   PLT 204.0 12/02/2014   GLUCOSE 98 12/02/2014   CHOL 195 12/18/2012   TRIG 32.0 12/18/2012   HDL 62.80 12/18/2012   LDLCALC 126* 12/18/2012   ALT 11 12/02/2014   AST 16 12/02/2014   NA 141 12/02/2014   K 4.6 12/02/2014   CL 103 12/02/2014   CREATININE 0.86 12/02/2014   BUN 11 12/02/2014   CO2 33* 12/02/2014   TSH 1.33 01/24/2014   HGBA1C 5.5 01/24/2014   Lab Results  Component Value Date   VITAMINB12 821 12/02/2014    ASSESSMENT AND PLAN:  Discussed the following assessment and plan:  B12 deficiency - neuro sx better  go to q 6 weeks ( she preferred over oral)  Medication management  Attention and concentration deficit - ok to reeval for meds  Rash and nonspecific skin eruption - better doesnt sound like zoster ok today   -Patient advised to return or notify health care team  if symptoms worsen ,persist or new concerns  arise.  Patient Instructions  b12 level is better  Change to every 6 weeks b12 injection. Get on schedule for this .  dont see shingles but get with [place] if  Nerve pain and early rash .   ROV   In a year.      Joyce [place]. Joyce [place] M.D.

## 2014-12-14 ENCOUNTER — Encounter (INDEPENDENT_AMBULATORY_CARE_PROVIDER_SITE_OTHER): Payer: Self-pay

## 2014-12-14 ENCOUNTER — Ambulatory Visit (INDEPENDENT_AMBULATORY_CARE_PROVIDER_SITE_OTHER): Payer: BLUE CROSS/BLUE SHIELD | Admitting: Family Medicine

## 2014-12-14 ENCOUNTER — Encounter: Payer: Self-pay | Admitting: Family Medicine

## 2014-12-14 VITALS — BP 103/68 | HR 70 | Ht 69.0 in | Wt 152.0 lb

## 2014-12-14 DIAGNOSIS — M79672 Pain in left foot: Secondary | ICD-10-CM | POA: Diagnosis not present

## 2014-12-14 DIAGNOSIS — M79671 Pain in right foot: Secondary | ICD-10-CM

## 2014-12-15 ENCOUNTER — Other Ambulatory Visit: Payer: Self-pay

## 2014-12-15 DIAGNOSIS — Z1231 Encounter for screening mammogram for malignant neoplasm of breast: Secondary | ICD-10-CM

## 2014-12-15 NOTE — Progress Notes (Signed)
PCP: Lorretta Harp, MD  Subjective:   HPI: Patient is a 54 y.o. female here for custom orthotics.  Patient returns for new custom orthotics. Did well with sports insoles with heel lift on right side previously. Initial problem was knee pain (patellofemoral syndrome) that was improved with orthotics and heel lift. Also had metatarsalgia. No new complaints otherwise, no current pain. No skin changes, fever.  Past Medical History  Diagnosis Date  . Palpitations     echo 2010  . Other abnormal glucose   . History of bulimia   . Depression   . Allergic rhinitis     Current Outpatient Prescriptions on File Prior to Visit  Medication Sig Dispense Refill  . Fluoxetine HCl, PMDD, 10 MG TABS Take 1 tablet (10 mg total) by mouth daily. 90 each 3   No current facility-administered medications on file prior to visit.    Past Surgical History  Procedure Laterality Date  . Hernia repair    . Repair of prolapsed rectum      Allergies  Allergen Reactions  . Penicillins   . Sulfonamide Derivatives     Social History   Social History  . Marital Status: Married    Spouse Name: N/A  . Number of Children: N/A  . Years of Education: N/A   Occupational History  . Not on file.   Social History Main Topics  . Smoking status: Never Smoker   . Smokeless tobacco: Never Used  . Alcohol Use: 0.0 oz/week    0 Standard drinks or equivalent per week     Comment: 2 glasses wine nightly  . Drug Use: No  . Sexual Activity: Not on file   Other Topics Concern  . 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    3 dogs pet turtles    Family History  Problem Relation Age of Onset  . Alcohol abuse    . Colon cancer Mother   . Diabetes Paternal Grandmother   . Other Son     had vsd that closed ?  Marland Kitchen Hypertension Father    . Heart attack Neg Hx   . Hyperlipidemia Neg Hx   . Sudden death Neg Hx   . ADD / ADHD Child     x3  . Diabetes Mellitus II Paternal Uncle     BP 103/68 mmHg  Pulse 70  Ht  (1.753 m)  Wt 152 lb (68.947 kg)  BMI 22.44 kg/m2  LMP 11/18/2010  Review of Systems: See HPI above.    Objective:  Physical Exam:  Gen: NAD  Bilateral feet/ankles: Transverse arch collapse R > L with mild callus formation 2nd-4th MT areas. Left leg 94.5 cm; right 93.5 cm ASIS to medial malleolus. No gross deformity, swelling, ecchymoses FROM ankles without pain. Mild hallux rigidus bilaterally.  Right sided bunion. No TTP Negative ant drawer and talar tilt.   Negative syndesmotic compression. Thompsons test negative. NV intact distally.    Assessment & Plan:  1. Gait abnormality - with leg length inequality, history of patellofemoral syndrome and metatarsalgia.  New custom orthotics made today.  Total prep time 50 minutes.  Orthotics felt very comfortable to patient.  Patient was fitted for a : standard, cushioned, semi-rigid orthotic. The orthotic was heated and afterward the patient stood  on the orthotic blank positioned on the orthotic stand. The patient was positioned in subtalar neutral position and 10 degrees of ankle dorsiflexion in a weight bearing stance. After completion of molding, a stable base was applied to the orthotic blank. The blank was ground to a stable position for weight bearing. Size: 10 blue swirl Base: blue med density eva Posting: none Additional orthotic padding: double base on right to account for leg length inequality

## 2014-12-15 NOTE — Assessment & Plan Note (Signed)
with leg length inequality, history of patellofemoral syndrome and metatarsalgia.  New custom orthotics made today.  Total prep time 50 minutes.  Orthotics felt very comfortable to patient.  Patient was fitted for a : standard, cushioned, semi-rigid orthotic. The orthotic was heated and afterward the patient stood on the orthotic blank positioned on the orthotic stand. The patient was positioned in subtalar neutral position and 10 degrees of ankle dorsiflexion in a weight bearing stance. After completion of molding, a stable base was applied to the orthotic blank. The blank was ground to a stable position for weight bearing. Size: 10 blue swirl Base: blue med density eva Posting: none Additional orthotic padding: double base on right to account for leg length inequality

## 2014-12-19 LAB — HM PAP SMEAR: HM Pap smear: NORMAL

## 2014-12-27 ENCOUNTER — Encounter: Payer: Self-pay | Admitting: Internal Medicine

## 2014-12-27 ENCOUNTER — Ambulatory Visit (INDEPENDENT_AMBULATORY_CARE_PROVIDER_SITE_OTHER): Payer: BLUE CROSS/BLUE SHIELD | Admitting: Internal Medicine

## 2014-12-27 VITALS — BP 104/64 | Temp 98.3°F | Wt 159.0 lb

## 2014-12-27 DIAGNOSIS — Z049 Encounter for examination and observation for unspecified reason: Secondary | ICD-10-CM

## 2014-12-27 DIAGNOSIS — R2242 Localized swelling, mass and lump, left lower limb: Secondary | ICD-10-CM | POA: Diagnosis not present

## 2014-12-27 NOTE — Patient Instructions (Signed)
The are on the left thigh doesnt not seem clincally significant .  Poss skin cyst  Not a lymph gland .    Lab Results  Component Value Date   WBC 4.1 12/02/2014   HGB 13.5 12/02/2014   HCT 39.8 12/02/2014   PLT 204.0 12/02/2014   GLUCOSE 98 12/02/2014   CHOL 195 12/18/2012   TRIG 32.0 12/18/2012   HDL 62.80 12/18/2012   LDLCALC 126* 12/18/2012   ALT 11 12/02/2014   AST 16 12/02/2014   NA 141 12/02/2014   K 4.6 12/02/2014   CL 103 12/02/2014   CREATININE 0.86 12/02/2014   BUN 11 12/02/2014   CO2 33* 12/02/2014   TSH 1.33 01/24/2014   HGBA1C 5.5 01/24/2014

## 2014-12-27 NOTE — Progress Notes (Signed)
Pre visit review using our clinic review tool, if applicable. No additional management support is needed unless otherwise documented below in the visit note.  Chief Complaint  Patient presents with  . Lump on Left Thigh    Getting bigger.    HPI: Patient Joyce Jefferson  comes in today for SDA for  problem evaluation.  Lump on leg there for awhil a bit bigger no trauma or pain but friend dx with leukemia and presented with lumps . No injury  Also area righgt jaqw line there since age 54 HS  Please check  No fver bleeding   Asks about hcm and last  Lipid panel Had gyne visit  Ok but fu blood in urine  ROS: See pertinent positives and negatives per HPI. Numbness and tingling ue when sleep s at times poss neck   Past Medical History  Diagnosis Date  . Palpitations     echo 2010  . Other abnormal glucose   . History of bulimia   . Depression   . Allergic rhinitis     Family History  Problem Relation Age of Onset  . Alcohol abuse    . Colon cancer Mother   . Diabetes Paternal Grandmother   . Other Son     had vsd that closed ?  Marland Kitchen. Hypertension Father   . Heart attack Neg Hx   . Hyperlipidemia Neg Hx   . Sudden death Neg Hx   . ADD / ADHD Child     x3  . Diabetes Mellitus II Paternal Uncle     Social History   Social History  . Marital Status: Married    Spouse Name: N/A  . Number of Children: N/A  . Years of Education: N/A   Social History Main Topics  . Smoking status: Never Smoker   . Smokeless tobacco: Never Used  . Alcohol Use: 0.0 oz/week    0 Standard drinks or equivalent per week     Comment: 2 glasses wine nightly  . Drug Use: No  . Sexual Activity: Not Asked   Other Topics Concern  . 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    3 dogs pet turtles    Outpatient Prescriptions Prior to  Visit  Medication Sig Dispense Refill  . Fluoxetine HCl, PMDD, 10 MG TABS Take 1 tablet (10 mg total) by mouth daily. 90 each 3   No facility-administered medications prior to visit.     EXAM:  BP 104/64 mmHg  Temp(Src) 98.3 F (36.8 C) (Oral)  Wt 159 lb (72.122 kg)  LMP 11/18/2010  Body mass index is 23.47 kg/(m^2).  GENERAL: vitals reviewed and listed above, alert, oriented, appears well hydrated and in no acute distress HEENT: atraumatic, conjunctiva  clear, no obvious abnormalities on inspection of external nose and ears NECK: no obvious masses on inspection palpation  No adneopathy  CV: HRRR, no clubbing cyanosis or  peripheral edema nl cap refill  MS: moves all extremities without noticeable focal  Abnormality Left thight mid anterior is a superficial cystic lesinos palpated smooth about marble sized no skin changes r jas timy mobil poss ln no shoddy inguinal ln no axillary nodes  PSYCH: pleasant and cooperative, no obvious depression or anxiety revewied last lab check cna do wellness in a year  Or as needed  ASSESSMENT AND PLAN:  Discussed the following assessment and plan:  Skin lump of leg, left - poss cyst no adenpathy   Suspected condition not found - reviewed what to look for for recheck  Total visit > 50% spent counseling and coordinating care as indicated in above note and in instructions to patient .     -Patient advised to return or notify health care team  if symptoms worsen ,persist or new concerns arise.  Patient Instructions   The are on the left thigh doesnt not seem clincally significant .  Poss skin cyst  Not a lymph gland .    Lab Results  Component Value Date   WBC 4.1 12/02/2014   HGB 13.5 12/02/2014   HCT 39.8 12/02/2014   PLT 204.0 12/02/2014   GLUCOSE 98 12/02/2014   CHOL 195 12/18/2012   TRIG 32.0 12/18/2012   HDL 62.80 12/18/2012   LDLCALC 126* 12/18/2012   ALT 11 12/02/2014   AST 16 12/02/2014   NA 141 12/02/2014   K  4.6 12/02/2014   CL 103 12/02/2014   CREATININE 0.86 12/02/2014   BUN 11 12/02/2014   CO2 33* 12/02/2014   TSH 1.33 01/24/2014   HGBA1C 5.5 01/24/2014        Burna Mortimer K. Raziel Koenigs M.D.

## 2015-01-18 ENCOUNTER — Ambulatory Visit (INDEPENDENT_AMBULATORY_CARE_PROVIDER_SITE_OTHER): Payer: BLUE CROSS/BLUE SHIELD | Admitting: Family Medicine

## 2015-01-18 DIAGNOSIS — E538 Deficiency of other specified B group vitamins: Secondary | ICD-10-CM

## 2015-01-18 MED ORDER — CYANOCOBALAMIN 1000 MCG/ML IJ SOLN
1000.0000 ug | Freq: Once | INTRAMUSCULAR | Status: AC
  Administered 2015-01-18: 1000 ug via INTRAMUSCULAR

## 2015-01-19 ENCOUNTER — Ambulatory Visit
Admission: RE | Admit: 2015-01-19 | Discharge: 2015-01-19 | Disposition: A | Discharge status: Home / Self Care | Payer: BLUE CROSS/BLUE SHIELD | Source: Ambulatory Visit

## 2015-01-19 DIAGNOSIS — Z1231 Encounter for screening mammogram for malignant neoplasm of breast: Secondary | ICD-10-CM

## 2015-02-27 ENCOUNTER — Ambulatory Visit: Payer: BLUE CROSS/BLUE SHIELD | Admitting: Family Medicine

## 2015-04-21 ENCOUNTER — Ambulatory Visit (INDEPENDENT_AMBULATORY_CARE_PROVIDER_SITE_OTHER): Payer: BLUE CROSS/BLUE SHIELD | Admitting: Family Medicine

## 2015-04-21 DIAGNOSIS — E538 Deficiency of other specified B group vitamins: Secondary | ICD-10-CM | POA: Diagnosis not present

## 2015-04-21 MED ORDER — CYANOCOBALAMIN 1000 MCG/ML IJ SOLN
1000.0000 ug | Freq: Once | INTRAMUSCULAR | Status: AC
  Administered 2015-04-21: 1000 ug via INTRAMUSCULAR

## 2015-05-26 ENCOUNTER — Other Ambulatory Visit: Payer: Self-pay | Admitting: Internal Medicine

## 2015-05-29 NOTE — Telephone Encounter (Signed)
Sent to the pharmacy by e-scribe.  Pt has appt on 06/20/15

## 2015-06-19 NOTE — Progress Notes (Signed)
Chief Complaint  Patient presents with  . Annual Exam    HPI: Patient  Joyce Jefferson  55 y.o. comes in today for Preventive Health Care visit   and med management  4 day per week  takin fluoxetine  Menopausa;l .   Since exercising adn eating healthu feels much better mood and concentrationi b12 uinj 2 months ago  Health Maintenance  Topic Date Due  . Hepatitis C Screening  December 22, 1960  . HIV Screening  04/17/1976  . PAP SMEAR  08/28/2012  . INFLUENZA VACCINE  08/29/2015  . TETANUS/TDAP  09/21/2016  . MAMMOGRAM  01/18/2017  . COLONOSCOPY  09/03/2022   Health Maintenance Review LIFESTYLE:  Exercise:  6 days a week spin and integrative weights  Cardio . 3-4 day and yoga and walk dogs  2-4 mile  Tobacco/ETS:no Alcohol: wine q d 1-2  Sugar beverages:  no Sleep:  About 8 hours  Drug use: no hh of 2  5 year colon  Cause of fam hx   ROS:  GEN/ HEENT: No fever, significant weight changes sweats headaches vision problems hearing changes, CV/ PULM; No chest pain shortness of breath cough, syncope,edema  change in exercise tolerance. GI /GU: No adominal pain, vomiting, change in bowel habits. No blood in the stool. No significant GU symptoms. SKIN/HEME: ,no acute skin rashes suspicious lesions or bleeding. No lymphadenopathy, nodules, masses.  NEURO/ PSYCH:  No neurologic signs such as weakness numbness. Better since b 12 and lsi  No depression anxiety. Stable on meds   IMM/ Allergy: No unusual infections.  Allergy .   REST of 12 system review negative except as per HPI   Past Medical History  Diagnosis Date  . Palpitations     echo 2010  . Other abnormal glucose   . History of bulimia   . Depression   . Allergic rhinitis     Past Surgical History  Procedure Laterality Date  . Hernia repair    . Repair of prolapsed rectum      Family History  Problem Relation Age of Onset  . Alcohol abuse    . Colon cancer Mother   . Diabetes Paternal Grandmother   .  Other Son     had vsd that closed ?  Marland Kitchen Hypertension Father   . Heart attack Neg Hx   . Hyperlipidemia Neg Hx   . Sudden death Neg Hx   . ADD / ADHD Child     x3  . Diabetes Mellitus II Paternal Uncle     Social History   Social History  . Marital Status: Married    Spouse Name: N/A  . Number of Children: N/A  . Years of Education: N/A   Social History Main Topics  . Smoking status: Never Smoker   . Smokeless tobacco: Never Used  . Alcohol Use: 0.0 oz/week    0 Standard drinks or equivalent per week     Comment: 2 glasses wine nightly  . Drug Use: No  . Sexual Activity: Not Asked   Other Topics Concern  . 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    3 dogs pet turtles    Outpatient Prescriptions Prior to Visit  Medication Sig Dispense Refill  . FLUoxetine (PROZAC) 10 MG capsule TAKE 1 CAP BY  MOUTH ONCE DAILY. WEAN AS DIRECTED. 90 capsule 0  . Fluoxetine HCl, PMDD, 10 MG TABS Take 1 tablet (10 mg total) by mouth daily. 90 each 3   No facility-administered medications prior to visit.     EXAM:  BP 110/56 mmHg  Pulse 79  Temp(Src) 97.5 F (36.4 C) (Oral)  Ht '5\' 9"'$  (1.753 m)  Wt 155 lb (70.308 kg)  BMI 22.88 kg/m2  SpO2 99%  LMP 11/18/2010  Body mass index is 22.88 kg/(m^2).  Physical Exam: Vital signs reviewed [id number] is a well-developed well-nourished alert cooperative    who appearsr stated age in no acute distress.  HEENT: normocephalic atraumatic , Eyes: PERRL EOM's full, conjunctiva clear, Nares: paten,t no deformity discharge or tenderness., Ears: no deformity EAC's clear TMs with normal landmarks. Mouth: clear OP, no lesions, edema.  Moist mucous membranes. Dentition in adequate repair. NECK: supple without masses, thyromegaly or bruits. CHEST/PULM:  Clear to auscultation and percussion breath  sounds equal no wheeze , rales or rhonchi. No chest wall deformities or tenderness. CV: PMI is nondisplaced, S1 S2 no gallops, murmurs, rubs. intermittnet premature beats .  Rate 58.  Peripheral pulses are full without delay.No JVD .  ABDOMEN: Bowel sounds normal nontender  No guard or rebound, no hepato splenomegal no CVA tenderness.  No hernia. Extremtities:  No clubbing cyanosis or edema, no acute joint swelling or redness no focal atrophy NEURO:  Oriented x3, cranial nerves 3-12 appear to be intact, no obvious focal weakness,gait within normal limits no abnormal reflexes or asymmetrical SKIN: No acute rashes normal turgor, color, no bruising or petechiae. PSYCH: Oriented, good eye contact, no obvious depression anxiety, cognition and judgment appear normal. LN: no cervical axillary inguinal adenopathy  Lab Results  Component Value Date   WBC 4.1 12/02/2014   HGB 13.5 12/02/2014   HCT 39.8 12/02/2014   PLT 204.0 12/02/2014   GLUCOSE 98 12/02/2014   CHOL 195 12/18/2012   TRIG 32.0 12/18/2012   HDL 62.80 12/18/2012   LDLCALC 126* 12/18/2012   ALT 11 12/02/2014   AST 16 12/02/2014   NA 141 12/02/2014   K 4.6 12/02/2014   CL 103 12/02/2014   CREATININE 0.86 12/02/2014   BUN 11 12/02/2014   CO2 33* 12/02/2014   TSH 1.33 01/24/2014   HGBA1C 5.5 01/24/2014   Lab Results  Component Value Date   VITAMINB12 821 12/02/2014     ASSESSMENT AND PLAN:  Discussed the following assessment and plan:  Visit for preventive health examination - Plan: Basic metabolic panel, Hemoglobin A1c, Lipid panel, TSH, Vitamin B12, CBC with Differential/Platelet  Medication management - Plan: Basic metabolic panel, Hemoglobin A1c, Lipid panel, TSH, Vitamin B12, CBC with Differential/Platelet  B12 deficiency - Plan: Basic metabolic panel, Hemoglobin A1c, Lipid panel, TSH, Vitamin B12, CBC with Differential/Platelet  Need for hepatitis C screening test - Plan: Hepatitis C antibody  Need for  prophylactic vaccination with tetanus-diphtheria (TD) - Plan: Td : Tetanus/diphtheria >7yo Preservative  free  Patient Care Team: Burnis Medin, MD as PCP - General Molli Posey, MD (Obstetrics and Gynecology) Druscilla Brownie, MD (Dermatology) Patient Instructions  Continue lifestyle intervention healthy eating and exercise . Will notify you  of labs when available. Also view on My Chart.  b 12 ok then take 1000  Mcg b12 oral per day to maintain  And we may check one more level in 6 months  To ensure stability    Health Maintenance, Female Adopting a healthy lifestyle and getting  preventive care can go a long way to promote health and wellness. Talk with your health care provider about what schedule of regular examinations is right for you. This is a good chance for you to check in with your provider about disease prevention and staying healthy. In between checkups, there are plenty of things you can do on your own. Experts have done a lot of research about which lifestyle changes and preventive measures are most likely to keep you healthy. Ask your health care provider for more information. WEIGHT AND DIET  Eat a healthy diet  Be sure to include plenty of vegetables, fruits, low-fat dairy products, and lean protein.  Do not eat a lot of foods high in solid fats, added sugars, or salt.  Get regular exercise. This is one of the most important things you can do for your health.  Most adults should exercise for at least 150 minutes each week. The exercise should increase your heart rate and make you sweat (moderate-intensity exercise).  Most adults should also do strengthening exercises at least twice a week. This is in addition to the moderate-intensity exercise.  Maintain a healthy weight  Body mass index (BMI) is a measurement that can be used to identify possible weight problems. It estimates body fat based on height and weight. Your health care provider can help determine your  BMI and help you achieve or maintain a healthy weight.  For females 44 years of age and older:   A BMI below 18.5 is considered underweight.  A BMI of 18.5 to 24.9 is normal.  A BMI of 25 to 29.9 is considered overweight.  A BMI of 30 and above is considered obese.  Watch levels of cholesterol and blood lipids  You should start having your blood tested for lipids and cholesterol at 54 years of age, then have this test every 5 years.  You may need to have your cholesterol levels checked more often if:  Your lipid or cholesterol levels are high.  You are older than 54 years of age.  You are at high risk for heart disease.  CANCER SCREENING   Lung Cancer  Lung cancer screening is recommended for adults 41-93 years old who are at high risk for lung cancer because of a history of smoking.  A yearly low-dose CT scan of the lungs is recommended for people who:  Currently smoke.  Have quit within the past 15 years.  Have at least a 30-pack-year history of smoking. A pack year is smoking an average of one pack of cigarettes a day for 1 year.  Yearly screening should continue until it has been 15 years since you quit.  Yearly screening should stop if you develop a health problem that would prevent you from having lung cancer treatment.  Breast Cancer  Practice breast self-awareness. This means understanding how your breasts normally appear and feel.  It also means doing regular breast self-exams. Let your health care provider know about any changes, no matter how small.  If you are in your 20s or 30s, you should have a clinical breast exam (CBE) by a health care provider every 1-3 years as part of a regular health exam.  If you are 23 or older, have a CBE every year. Also consider having a breast X-ray (mammogram) every year.  If you have a family history of breast cancer, talk to your health care provider about genetic screening.  If you are at high risk for breast  cancer, talk  to your health care provider about having an MRI and a mammogram every year.  Breast cancer gene (BRCA) assessment is recommended for women who have family members with BRCA-related cancers. BRCA-related cancers include:  Breast.  Ovarian.  Tubal.  Peritoneal cancers.  Results of the assessment will determine the need for genetic counseling and BRCA1 and BRCA2 testing. Cervical Cancer Your health care provider may recommend that you be screened regularly for cancer of the pelvic organs (ovaries, uterus, and vagina). This screening involves a pelvic examination, including checking for microscopic changes to the surface of your cervix (Pap test). You may be encouraged to have this screening done every 3 years, beginning at age 65.  For women ages 69-65, health care providers may recommend pelvic exams and Pap testing every 3 years, or they may recommend the Pap and pelvic exam, combined with testing for human papilloma virus (HPV), every 5 years. Some types of HPV increase your risk of cervical cancer. Testing for HPV may also be done on women of any age with unclear Pap test results.  Other health care providers may not recommend any screening for nonpregnant women who are considered low risk for pelvic cancer and who do not have symptoms. Ask your health care provider if a screening pelvic exam is right for you.  If you have had past treatment for cervical cancer or a condition that could lead to cancer, you need Pap tests and screening for cancer for at least 20 years after your treatment. If Pap tests have been discontinued, your risk factors (such as having a new sexual partner) need to be reassessed to determine if screening should resume. Some women have medical problems that increase the chance of getting cervical cancer. In these cases, your health care provider may recommend more frequent screening and Pap tests. Colorectal Cancer  This type of cancer can be detected and  often prevented.  Routine colorectal cancer screening usually begins at 54 years of age and continues through 54 years of age.  Your health care provider may recommend screening at an earlier age if you have risk factors for colon cancer.  Your health care provider may also recommend using home test kits to check for hidden blood in the stool.  A small camera at the end of a tube can be used to examine your colon directly (sigmoidoscopy or colonoscopy). This is done to check for the earliest forms of colorectal cancer.  Routine screening usually begins at age 81.  Direct examination of the colon should be repeated every 5-10 years through 54 years of age. However, you may need to be screened more often if early forms of precancerous polyps or small growths are found. Skin Cancer  Check your skin from head to toe regularly.  Tell your health care provider about any new moles or changes in moles, especially if there is a change in a mole's shape or color.  Also tell your health care provider if you have a mole that is larger than the size of a pencil eraser.  Always use sunscreen. Apply sunscreen liberally and repeatedly throughout the day.  Protect yourself by wearing long sleeves, pants, a wide-brimmed hat, and sunglasses whenever you are outside. HEART DISEASE, DIABETES, AND HIGH BLOOD PRESSURE   High blood pressure causes heart disease and increases the risk of stroke. High blood pressure is more likely to develop in:  People who have blood pressure in the high end of the normal range (130-139/85-89 mm Hg).  People  who are overweight or obese.  People who are African American.  If you are 17-76 years of age, have your blood pressure checked every 3-5 years. If you are 2 years of age or older, have your blood pressure checked every year. You should have your blood pressure measured twice--once when you are at a hospital or clinic, and once when you are not at a hospital or clinic.  Record the average of the two measurements. To check your blood pressure when you are not at a hospital or clinic, you can use:  An automated blood pressure machine at a pharmacy.  A home blood pressure monitor.  If you are between 80 years and 23 years old, ask your health care provider if you should take aspirin to prevent strokes.  Have regular diabetes screenings. This involves taking a blood sample to check your fasting blood sugar level.  If you are at a normal weight and have a low risk for diabetes, have this test once every three years after 54 years of age.  If you are overweight and have a high risk for diabetes, consider being tested at a younger age or more often. PREVENTING INFECTION  Hepatitis B  If you have a higher risk for hepatitis B, you should be screened for this virus. You are considered at high risk for hepatitis B if:  You were born in a country where hepatitis B is common. Ask your health care provider which countries are considered high risk.  Your parents were born in a high-risk country, and you have not been immunized against hepatitis B (hepatitis B vaccine).  You have HIV or AIDS.  You use needles to inject street drugs.  You live with someone who has hepatitis B.  You have had sex with someone who has hepatitis B.  You get hemodialysis treatment.  You take certain medicines for conditions, including cancer, organ transplantation, and autoimmune conditions. Hepatitis C  Blood testing is recommended for:  Everyone born from [birth year] through 1964.  Anyone with known risk factors for hepatitis C. Sexually transmitted infections (STIs)  You should be screened for sexually transmitted infections (STIs) including gonorrhea and chlamydia if:  You are sexually active and are younger than 54 years of age.  You are older than 54 years of age and your health care provider tells you that you are at risk for this type of infection.  Your sexual activity  has changed since you were last screened and you are at an increased risk for chlamydia or gonorrhea. Ask your health care provider if you are at risk.  If you do not have HIV, but are at risk, it may be recommended that you take a prescription medicine daily to prevent HIV infection. This is called pre-exposure prophylaxis (PrEP). You are considered at risk if:  You are sexually active and do not regularly use condoms or know the HIV status of your partner(s).  You take drugs by injection.  You are sexually active with a partner who has HIV. Talk with your health care provider about whether you are at high risk of being infected with HIV. If you choose to begin PrEP, you should first be tested for HIV. You should then be tested every 3 months for as long as you are taking PrEP.  PREGNANCY   If you are premenopausal and you may become pregnant, ask your health care provider about preconception counseling.  If you may become pregnant, take 400 to 800 micrograms (mcg) of  folic acid every day.  If you want to prevent pregnancy, talk to your health care provider about birth control (contraception). OSTEOPOROSIS AND MENOPAUSE   Osteoporosis is a disease in which the bones lose minerals and strength with aging. This can result in serious bone fractures. Your risk for osteoporosis can be identified using a bone density scan.  If you are 28 years of age or older, or if you are at risk for osteoporosis and fractures, ask your health care provider if you should be screened.  Ask your health care provider whether you should take a calcium or vitamin D supplement to lower your risk for osteoporosis.  Menopause may have certain physical symptoms and risks.  Hormone replacement therapy may reduce some of these symptoms and risks. Talk to your health care provider about whether hormone replacement therapy is right for you.  HOME CARE INSTRUCTIONS   Schedule regular health, dental, and eye  exams.  Stay current with your immunizations.   Do not use any tobacco products including cigarettes, chewing tobacco, or electronic cigarettes.  If you are pregnant, do not drink alcohol.  If you are breastfeeding, limit how much and how often you drink alcohol.  Limit alcohol intake to no more than 1 drink per day for nonpregnant women. One drink equals 12 ounces of beer, 5 ounces of wine, or 1 ounces of hard liquor.  Do not use street drugs.  Do not share needles.  Ask your health care provider for help if you need support or information about quitting drugs.  Tell your health care provider if you often feel depressed.  Tell your health care provider if you have ever been abused or do not feel safe at home.   This information is not intended to replace advice given to you by your health care provider. Make sure you discuss any questions you have with your health care provider.   Document Released: 07/30/2010 Document Revised: 02/04/2014 Document Reviewed: 12/16/2012 Elsevier Interactive Patient Education 2016 [place] K. Gwynn Chalker M.D.

## 2015-06-20 ENCOUNTER — Ambulatory Visit (INDEPENDENT_AMBULATORY_CARE_PROVIDER_SITE_OTHER): Payer: BLUE CROSS/BLUE SHIELD | Admitting: Internal Medicine

## 2015-06-20 ENCOUNTER — Encounter: Payer: Self-pay | Admitting: Internal Medicine

## 2015-06-20 VITALS — BP 110/56 | HR 79 | Temp 97.5°F | Ht 69.0 in | Wt 155.0 lb

## 2015-06-20 DIAGNOSIS — Z0000 Encounter for general adult medical examination without abnormal findings: Secondary | ICD-10-CM | POA: Diagnosis not present

## 2015-06-20 DIAGNOSIS — E538 Deficiency of other specified B group vitamins: Secondary | ICD-10-CM | POA: Diagnosis not present

## 2015-06-20 DIAGNOSIS — Z1159 Encounter for screening for other viral diseases: Secondary | ICD-10-CM

## 2015-06-20 DIAGNOSIS — Z79899 Other long term (current) drug therapy: Secondary | ICD-10-CM | POA: Diagnosis not present

## 2015-06-20 DIAGNOSIS — Z23 Encounter for immunization: Secondary | ICD-10-CM | POA: Diagnosis not present

## 2015-06-20 LAB — BASIC METABOLIC PANEL
BUN: 13 mg/dL (ref 6–23)
CHLORIDE: 104 meq/L (ref 96–112)
CO2: 32 meq/L (ref 19–32)
Calcium: 9.3 mg/dL (ref 8.4–10.5)
Creatinine, Ser: 0.84 mg/dL (ref 0.40–1.20)
GFR: 75.05 mL/min (ref >60.00)
GLUCOSE: 95 mg/dL (ref 70–99)
POTASSIUM: 4.2 meq/L (ref 3.5–5.1)
SODIUM: 139 meq/L (ref 135–145)

## 2015-06-20 LAB — CBC WITH DIFFERENTIAL/PLATELET
BASOS PCT: 0.7 % (ref 0.0–3.0)
Basophils Absolute: 0 10*3/uL (ref 0.0–0.1)
EOS PCT: 2.1 % (ref 0.0–5.0)
Eosinophils Absolute: 0.1 10*3/uL (ref 0.0–0.7)
HCT: 39.1 % (ref 36.0–46.0)
HEMOGLOBIN: 13.1 g/dL (ref 12.0–15.0)
LYMPHS ABS: 1.5 10*3/uL (ref 0.7–4.0)
Lymphocytes Relative: 32.1 % (ref 12.0–46.0)
MCHC: 33.5 g/dL (ref 30.0–36.0)
MCV: 88 fl (ref 78.0–100.0)
MONO ABS: 0.3 10*3/uL (ref 0.1–1.0)
Monocytes Relative: 7 % (ref 3.0–12.0)
NEUTROS ABS: 2.7 10*3/uL (ref 1.4–7.7)
Neutrophils Relative %: 58.1 % (ref 43.0–77.0)
Platelets: 207 10*3/uL (ref 150.0–400.0)
RBC: 4.44 Mil/uL (ref 3.87–5.11)
RDW: 13.4 % (ref 11.5–15.5)
WBC: 4.6 10*3/uL (ref 4.0–10.5)

## 2015-06-20 LAB — LIPID PANEL
CHOL/HDL RATIO: 3
Cholesterol: 206 mg/dL — ABNORMAL HIGH (ref 0–200)
HDL: 62.5 mg/dL (ref >39.00)
LDL CALC: 131 mg/dL — AB (ref 0–99)
NonHDL: 143.86
TRIGLYCERIDES: 66 mg/dL (ref 0.0–149.0)
VLDL: 13.2 mg/dL (ref 0.0–40.0)

## 2015-06-20 LAB — VITAMIN B12: Vitamin B-12: 317 pg/mL (ref 211–911)

## 2015-06-20 LAB — HEMOGLOBIN A1C: Hgb A1c MFr Bld: 5.5 % (ref 4.6–6.5)

## 2015-06-20 LAB — TSH: TSH: 2.23 u[IU]/mL (ref 0.35–4.50)

## 2015-06-20 NOTE — Patient Instructions (Addendum)
Continue lifestyle intervention healthy eating and exercise . Will notify you  of labs when available. Also view on My Chart.  b 12 ok then take 1000  Mcg b12 oral per day to maintain  And we may check one more level in 6 months  To ensure stability    Health Maintenance, Female Adopting a healthy lifestyle and getting preventive care can go a long way to promote health and wellness. Talk with your health care provider about what schedule of regular examinations is right for you. This is a good chance for you to check in with your provider about disease prevention and staying healthy. In between checkups, there are plenty of things you can do on your own. Experts have done a lot of research about which lifestyle changes and preventive measures are most likely to keep you healthy. Ask your health care provider for more information. WEIGHT AND DIET  Eat a healthy diet  Be sure to include plenty of vegetables, fruits, low-fat dairy products, and lean protein.  Do not eat a lot of foods high in solid fats, added sugars, or salt.  Get regular exercise. This is one of the most important things you can do for your health.  Most adults should exercise for at least 150 minutes each week. The exercise should increase your heart rate and make you sweat (moderate-intensity exercise).  Most adults should also do strengthening exercises at least twice a week. This is in addition to the moderate-intensity exercise.  Maintain a healthy weight  Body mass index (BMI) is a measurement that can be used to identify possible weight problems. It estimates body fat based on height and weight. Your health care provider can help determine your BMI and help you achieve or maintain a healthy weight.  For females 92 years of age and older:   A BMI below 18.5 is considered underweight.  A BMI of 18.5 to 24.9 is normal.  A BMI of 25 to 29.9 is considered overweight.  A BMI of 30 and above is considered obese.   Watch levels of cholesterol and blood lipids  You should start having your blood tested for lipids and cholesterol at 54 years of age, then have this test every 5 years.  You may need to have your cholesterol levels checked more often if:  Your lipid or cholesterol levels are high.  You are older than 54 years of age.  You are at high risk for heart disease.  CANCER SCREENING   Lung Cancer  Lung cancer screening is recommended for adults 8-45 years old who are at high risk for lung cancer because of a history of smoking.  A yearly low-dose CT scan of the lungs is recommended for people who:  Currently smoke.  Have quit within the past 15 years.  Have at least a 30-pack-year history of smoking. A pack year is smoking an average of one pack of cigarettes a day for 1 year.  Yearly screening should continue until it has been 15 years since you quit.  Yearly screening should stop if you develop a health problem that would prevent you from having lung cancer treatment.  Breast Cancer  Practice breast self-awareness. This means understanding how your breasts normally appear and feel.  It also means doing regular breast self-exams. Let your health care provider know about any changes, no matter how small.  If you are in your 20s or 30s, you should have a clinical breast exam (CBE) by a health care  provider every 1-3 years as part of a regular health exam.  If you are 40 or older, have a CBE every year. Also consider having a breast X-ray (mammogram) every year.  If you have a family history of breast cancer, talk to your health care provider about genetic screening.  If you are at high risk for breast cancer, talk to your health care provider about having an MRI and a mammogram every year.  Breast cancer gene (BRCA) assessment is recommended for women who have family members with BRCA-related cancers. BRCA-related cancers  include:  Breast.  Ovarian.  Tubal.  Peritoneal cancers.  Results of the assessment will determine the need for genetic counseling and BRCA1 and BRCA2 testing. Cervical Cancer Your health care provider may recommend that you be screened regularly for cancer of the pelvic organs (ovaries, uterus, and vagina). This screening involves a pelvic examination, including checking for microscopic changes to the surface of your cervix (Pap test). You may be encouraged to have this screening done every 3 years, beginning at age 21.  For women ages 30-65, health care providers may recommend pelvic exams and Pap testing every 3 years, or they may recommend the Pap and pelvic exam, combined with testing for human papilloma virus (HPV), every 5 years. Some types of HPV increase your risk of cervical cancer. Testing for HPV may also be done on women of any age with unclear Pap test results.  Other health care providers may not recommend any screening for nonpregnant women who are considered low risk for pelvic cancer and who do not have symptoms. Ask your health care provider if a screening pelvic exam is right for you.  If you have had past treatment for cervical cancer or a condition that could lead to cancer, you need Pap tests and screening for cancer for at least 20 years after your treatment. If Pap tests have been discontinued, your risk factors (such as having a new sexual partner) need to be reassessed to determine if screening should resume. Some women have medical problems that increase the chance of getting cervical cancer. In these cases, your health care provider may recommend more frequent screening and Pap tests. Colorectal Cancer  This type of cancer can be detected and often prevented.  Routine colorectal cancer screening usually begins at 54 years of age and continues through 54 years of age.  Your health care provider may recommend screening at an earlier age if you have risk factors for  colon cancer.  Your health care provider may also recommend using home test kits to check for hidden blood in the stool.  A small camera at the end of a tube can be used to examine your colon directly (sigmoidoscopy or colonoscopy). This is done to check for the earliest forms of colorectal cancer.  Routine screening usually begins at age 50.  Direct examination of the colon should be repeated every 5-10 years through 54 years of age. However, you may need to be screened more often if early forms of precancerous polyps or small growths are found. Skin Cancer  Check your skin from head to toe regularly.  Tell your health care provider about any new moles or changes in moles, especially if there is a change in a mole's shape or color.  Also tell your health care provider if you have a mole that is larger than the size of a pencil eraser.  Always use sunscreen. Apply sunscreen liberally and repeatedly throughout the day.  Protect yourself   by wearing long sleeves, pants, a wide-brimmed hat, and sunglasses whenever you are outside. HEART DISEASE, DIABETES, AND HIGH BLOOD PRESSURE   High blood pressure causes heart disease and increases the risk of stroke. High blood pressure is more likely to develop in:  People who have blood pressure in the high end of the normal range (130-139/85-89 mm Hg).  People who are overweight or obese.  People who are African American.  If you are 69-2 years of age, have your blood pressure checked every 3-5 years. If you are 13 years of age or older, have your blood pressure checked every year. You should have your blood pressure measured twice--once when you are at a hospital or clinic, and once when you are not at a hospital or clinic. Record the average of the two measurements. To check your blood pressure when you are not at a hospital or clinic, you can use:  An automated blood pressure machine at a pharmacy.  A home blood pressure monitor.  If you  are between 66 years and 34 years old, ask your health care provider if you should take aspirin to prevent strokes.  Have regular diabetes screenings. This involves taking a blood sample to check your fasting blood sugar level.  If you are at a normal weight and have a low risk for diabetes, have this test once every three years after 54 years of age.  If you are overweight and have a high risk for diabetes, consider being tested at a younger age or more often. PREVENTING INFECTION  Hepatitis B  If you have a higher risk for hepatitis B, you should be screened for this virus. You are considered at high risk for hepatitis B if:  You were born in a country where hepatitis B is common. Ask your health care provider which countries are considered high risk.  Your parents were born in a high-risk country, and you have not been immunized against hepatitis B (hepatitis B vaccine).  You have HIV or AIDS.  You use needles to inject street drugs.  You live with someone who has hepatitis B.  You have had sex with someone who has hepatitis B.  You get hemodialysis treatment.  You take certain medicines for conditions, including cancer, organ transplantation, and autoimmune conditions. Hepatitis C  Blood testing is recommended for:  Everyone born from [birth year] through 1964.  Anyone with known risk factors for hepatitis C. Sexually transmitted infections (STIs)  You should be screened for sexually transmitted infections (STIs) including gonorrhea and chlamydia if:  You are sexually active and are younger than 54 years of age.  You are older than 54 years of age and your health care provider tells you that you are at risk for this type of infection.  Your sexual activity has changed since you were last screened and you are at an increased risk for chlamydia or gonorrhea. Ask your health care provider if you are at risk.  If you do not have HIV, but are at risk, it may be recommended that you  take a prescription medicine daily to prevent HIV infection. This is called pre-exposure prophylaxis (PrEP). You are considered at risk if:  You are sexually active and do not regularly use condoms or know the HIV status of your partner(s).  You take drugs by injection.  You are sexually active with a partner who has HIV. Talk with your health care provider about whether you are at high risk of being infected with HIV.  If you choose to begin PrEP, you should first be tested for HIV. You should then be tested every 3 months for as long as you are taking PrEP.  PREGNANCY   If you are premenopausal and you may become pregnant, ask your health care provider about preconception counseling.  If you may become pregnant, take 400 to 800 micrograms (mcg) of folic acid every day.  If you want to prevent pregnancy, talk to your health care provider about birth control (contraception). OSTEOPOROSIS AND MENOPAUSE   Osteoporosis is a disease in which the bones lose minerals and strength with aging. This can result in serious bone fractures. Your risk for osteoporosis can be identified using a bone density scan.  If you are 28 years of age or older, or if you are at risk for osteoporosis and fractures, ask your health care provider if you should be screened.  Ask your health care provider whether you should take a calcium or vitamin D supplement to lower your risk for osteoporosis.  Menopause may have certain physical symptoms and risks.  Hormone replacement therapy may reduce some of these symptoms and risks. Talk to your health care provider about whether hormone replacement therapy is right for you.  HOME CARE INSTRUCTIONS   Schedule regular health, dental, and eye exams.  Stay current with your immunizations.   Do not use any tobacco products including cigarettes, chewing tobacco, or electronic cigarettes.  If you are pregnant, do not drink alcohol.  If you are breastfeeding, limit how  much and how often you drink alcohol.  Limit alcohol intake to no more than 1 drink per day for nonpregnant women. One drink equals 12 ounces of beer, 5 ounces of wine, or 1 ounces of hard liquor.  Do not use street drugs.  Do not share needles.  Ask your health care provider for help if you need support or information about quitting drugs.  Tell your health care provider if you often feel depressed.  Tell your health care provider if you have ever been abused or do not feel safe at home.   This information is not intended to replace advice given to you by your health care provider. Make sure you discuss any questions you have with your health care provider.   Document Released: 07/30/2010 Document Revised: 02/04/2014 Document Reviewed: 12/16/2012 [employer] Interactive Patient Education [employer].

## 2015-06-20 NOTE — Progress Notes (Signed)
Pre visit review using our clinic review tool, if applicable. No additional management support is needed unless otherwise documented below in the visit note. 

## 2015-06-21 LAB — HEPATITIS C ANTIBODY: HCV Ab: NEGATIVE

## 2015-07-13 ENCOUNTER — Encounter: Payer: Self-pay | Admitting: Family Medicine

## 2016-10-18 ENCOUNTER — Encounter: Payer: Self-pay | Admitting: Internal Medicine
